# Patient Record
Sex: Female | Born: 2000 | Hispanic: Yes | Smoking: Former smoker
Health system: Southern US, Community
[De-identification: ages and names within clinical notes are randomized; demographics above are authoritative.]

## PROBLEM LIST (undated history)

## (undated) ENCOUNTER — Inpatient Hospital Stay: Payer: Self-pay

## (undated) ENCOUNTER — Ambulatory Visit: Admission: EM

## (undated) DIAGNOSIS — N83209 Unspecified ovarian cyst, unspecified side: Secondary | ICD-10-CM

## (undated) DIAGNOSIS — R519 Headache, unspecified: Secondary | ICD-10-CM

## (undated) DIAGNOSIS — F32A Depression, unspecified: Secondary | ICD-10-CM

## (undated) DIAGNOSIS — Z789 Other specified health status: Secondary | ICD-10-CM

## (undated) DIAGNOSIS — J45909 Unspecified asthma, uncomplicated: Secondary | ICD-10-CM

## (undated) DIAGNOSIS — D649 Anemia, unspecified: Secondary | ICD-10-CM

## (undated) HISTORY — DX: Unspecified ovarian cyst, unspecified side: N83.209

## (undated) HISTORY — DX: Anemia, unspecified: D64.9

## (undated) HISTORY — DX: Headache, unspecified: R51.9

## (undated) HISTORY — DX: Unspecified asthma, uncomplicated: J45.909

## (undated) HISTORY — DX: Depression, unspecified: F32.A

---
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
## 2018-01-28 ENCOUNTER — Emergency Department: Payer: Medicaid Other

## 2018-01-28 ENCOUNTER — Encounter: Payer: Self-pay | Admitting: Emergency Medicine

## 2018-01-28 ENCOUNTER — Emergency Department
Admission: EM | Admit: 2018-01-28 | Discharge: 2018-01-28 | Disposition: A | Discharge status: Home / Self Care | Payer: Medicaid Other | Attending: Student in an Organized Health Care Education/Training Program | Admitting: Student in an Organized Health Care Education/Training Program

## 2018-01-28 ENCOUNTER — Other Ambulatory Visit: Payer: Self-pay

## 2018-01-28 DIAGNOSIS — O209 Hemorrhage in early pregnancy, unspecified: Secondary | ICD-10-CM | POA: Insufficient documentation

## 2018-01-28 DIAGNOSIS — Z3A01 Less than 8 weeks gestation of pregnancy: Secondary | ICD-10-CM | POA: Diagnosis not present

## 2018-01-28 DIAGNOSIS — O4690 Antepartum hemorrhage, unspecified, unspecified trimester: Secondary | ICD-10-CM

## 2018-01-28 DIAGNOSIS — O208 Other hemorrhage in early pregnancy: Secondary | ICD-10-CM

## 2018-01-28 LAB — COMPREHENSIVE METABOLIC PANEL
ALK PHOS: 95 U/L (ref 38–126)
ALT: 24 U/L (ref 14–54)
ANION GAP: 8 (ref 5–15)
AST: 24 U/L (ref 15–41)
Albumin: 4.1 g/dL (ref 3.5–5.0)
BUN: 10 mg/dL (ref 6–20)
CALCIUM: 9.3 mg/dL (ref 8.9–10.3)
CO2: 28 mmol/L (ref 22–32)
CREATININE: 0.48 mg/dL (ref 0.44–1.00)
Chloride: 100 mmol/L — ABNORMAL LOW (ref 101–111)
GFR calc Af Amer: >60 mL/min (ref >60)
GFR calc non Af Amer: >60 mL/min (ref >60)
GLUCOSE: 136 mg/dL — AB (ref 65–99)
Potassium: 3 mmol/L — ABNORMAL LOW (ref 3.5–5.1)
SODIUM: 136 mmol/L (ref 135–145)
Total Bilirubin: 0.5 mg/dL (ref 0.3–1.2)
Total Protein: 7.6 g/dL (ref 6.5–8.1)

## 2018-01-28 LAB — URINALYSIS, COMPLETE (UACMP) WITH MICROSCOPIC
Bilirubin Urine: NEGATIVE
Glucose, UA: NEGATIVE mg/dL
HGB URINE DIPSTICK: NEGATIVE
Ketones, ur: 5 mg/dL — AB
Leukocytes, UA: NEGATIVE
NITRITE: NEGATIVE
Protein, ur: NEGATIVE mg/dL
Specific Gravity, Urine: 1.014 (ref 1.005–1.030)
pH: 6 (ref 5.0–8.0)

## 2018-01-28 LAB — ABO/RH: ABO/RH(D): O POS

## 2018-01-28 LAB — CBC
HEMATOCRIT: 42.5 % (ref 35.0–47.0)
HEMOGLOBIN: 14.3 g/dL (ref 12.0–16.0)
MCH: 30 pg (ref 26.0–34.0)
MCHC: 33.7 g/dL (ref 32.0–36.0)
MCV: 88.9 fL (ref 80.0–100.0)
Platelets: 188 10*3/uL (ref 150–440)
RBC: 4.78 MIL/uL (ref 3.80–5.20)
RDW: 13.7 % (ref 11.5–14.5)
WBC: 13.8 10*3/uL — AB (ref 3.6–11.0)

## 2018-01-28 LAB — HCG, QUANTITATIVE, PREGNANCY: hCG, Beta Chain, Quant, S: 23617 m[IU]/mL — ABNORMAL HIGH (ref <5)

## 2018-01-28 MED ORDER — DOXYLAMINE-PYRIDOXINE 10-10 MG PO TBEC: 1.0000 | DELAYED_RELEASE_TABLET | Freq: Two times a day (BID) | ORAL | 0 refills | Status: DC

## 2018-01-28 NOTE — Discharge Instructions (Addendum)

## 2018-01-28 NOTE — ED Triage Notes (Signed)
Pt in with co vaginal spotting since Saturday. Pt is [redacted] weeks pregnant, co lower abd cramping. Denies any dysuria at this time.

## 2018-01-28 NOTE — ED Provider Notes (Signed)
[facility] Emergency Department Provider Note    First MD Initiated Contact with Patient 01/28/18 2210     (approximate)  I have reviewed the triage vital signs and the nursing notes.   HISTORY  Chief Complaint Vaginal Bleeding    HPI Marcell Angerdith Cruz Flores is a 17 y.o. female G1P0 roughly [redacted] weeks pregnant [place] with several days of  crampy left lower quadrant pain as well as vaginal spotting started today.  Denies any nausea or vomiting.  No dysuria.  States the pain is mild to moderate.  Is not had any heavy vaginal bleeding.  No personal history of bleeding disorders.  No past medical history on file. No family history on file. No past surgical history on file. There are no active problems to display for this patient.     Prior to Admission medications   Medication Sig Start Date End Date Taking? Authorizing Provider  oxyCODONE-acetaminophen (PERCOCET) 5-325 MG tablet Take 1-2 tablets by mouth every 6 (six) hours as needed. 08/14/17   Emily FilbertWilliams, Jonathan E, MD    Allergies Penicillins and Augmentin [amoxicillin-pot clavulanate]    Social History Social History   Tobacco Use  . Smoking status: Never Smoker  . Smokeless tobacco: Never Used  Substance Use Topics  . Alcohol use: No  . Drug use: No    Review of Systems Patient denies headaches, rhinorrhea, blurry vision, numbness, shortness of breath, chest pain, edema, cough, abdominal pain, nausea, vomiting, diarrhea, dysuria, fevers, rashes or hallucinations unless otherwise stated above in HPI. ____________________________________________   PHYSICAL EXAM:  VITAL SIGNS: Vitals:   01/28/18 1956  BP: 117/62  Pulse: (!) 107  Resp: 20  Temp: 99.4 F (37.4 C)  SpO2: 99%    Constitutional: Alert and oriented. Well appearing and in no acute distress. Eyes: Conjunctivae are normal.  Head: Atraumatic. Nose: No congestion/rhinnorhea. Mouth/Throat: Mucous membranes are moist.   Neck:  No stridor. Painless ROM.  Cardiovascular: Normal rate, regular rhythm. Grossly normal heart sounds.  Good peripheral circulation. Respiratory: Normal respiratory effort.  No retractions. Lungs CTAB. Gastrointestinal: Soft and nontender. No distention. No abdominal bruits. No CVA tenderness. Genitourinary: deferred Musculoskeletal: No lower extremity tenderness nor edema.  No joint effusions. Neurologic:  Normal speech and language. No gross focal neurologic deficits are appreciated. No facial droop Skin:  Skin is warm, dry and intact. No rash noted. Psychiatric: Mood and affect are normal. Speech and behavior are normal.  ____________________________________________   LABS (all labs ordered are listed, but only abnormal results are displayed)  Results for orders placed or performed during the hospital encounter of 01/28/18 (from the past 24 hour(s))  CBC     Status: Abnormal   Collection Time: 01/28/18  8:01 PM  Result Value Ref Range   WBC 13.8 (H) 3.6 - 11.0 K/uL   RBC 4.78 3.80 - 5.20 MIL/uL   Hemoglobin 14.3 12.0 - 16.0 g/dL   HCT 16.142.5 09.635.0 - 04.547.0 %   MCV 88.9 80.0 - 100.0 fL   MCH 30.0 26.0 - 34.0 pg   MCHC 33.7 32.0 - 36.0 g/dL   RDW 40.913.7 81.111.5 - 91.414.5 %   Platelets 188 150 - 440 K/uL  Comprehensive metabolic panel     Status: Abnormal   Collection Time: 01/28/18  8:01 PM  Result Value Ref Range   Sodium 136 135 - 145 mmol/L   Potassium 3.0 (L) 3.5 - 5.1 mmol/L   Chloride 100 (L) 101 - 111 mmol/L   CO2  28 22 - 32 mmol/L   Glucose, Bld 136 (H) 65 - 99 mg/dL   BUN 10 6 - 20 mg/dL   Creatinine, Ser 1.61 0.44 - 1.00 mg/dL   Calcium 9.3 8.9 - 09.6 mg/dL   Total Protein 7.6 6.5 - 8.1 g/dL   Albumin 4.1 3.5 - 5.0 g/dL   AST 24 15 - 41 U/L   ALT 24 14 - 54 U/L   Alkaline Phosphatase 95 38 - 126 U/L   Total Bilirubin 0.5 0.3 - 1.2 mg/dL   GFR calc non Af Amer >60 >60 mL/min   GFR calc Af Amer >60 >60 mL/min   Anion gap 8 5 - 15  hCG, quantitative, pregnancy     Status:  Abnormal   Collection Time: 01/28/18  8:01 PM  Result Value Ref Range   hCG, Beta Chain, Quant, S 23,617 (H) <5 mIU/mL  Urinalysis, Complete w Microscopic     Status: Abnormal   Collection Time: 01/28/18  8:01 PM  Result Value Ref Range   Color, Urine YELLOW (A) YELLOW   APPearance CLEAR (A) CLEAR   Specific Gravity, Urine 1.014 1.005 - 1.030   pH 6.0 5.0 - 8.0   Glucose, UA NEGATIVE NEGATIVE mg/dL   Hgb urine dipstick NEGATIVE NEGATIVE   Bilirubin Urine NEGATIVE NEGATIVE   Ketones, ur 5 (A) NEGATIVE mg/dL   Protein, ur NEGATIVE NEGATIVE mg/dL   Nitrite NEGATIVE NEGATIVE   Leukocytes, UA NEGATIVE NEGATIVE   RBC / HPF 0-5 0 - 5 RBC/hpf   WBC, UA 0-5 0 - 5 WBC/hpf   Bacteria, UA RARE (A) NONE SEEN   Squamous Epithelial / LPF 0-5 (A) NONE SEEN   Mucus PRESENT   ABO/Rh     Status: None   Collection Time: 01/28/18  8:01 PM  Result Value Ref Range   ABO/RH(D)      O POS Performed at [facility], [address] [place], [place] [address]    ____________________________________________  ____________________________________________  RADIOLOGY  I personally reviewed all radiographic images ordered to evaluate for the above acute complaints and reviewed radiology reports and findings.  These findings were personally discussed with the patient.  Please see medical record for radiology report.  ____________________________________________   PROCEDURES  Procedure(s) performed:  Procedures    Critical Care performed: no ____________________________________________   INITIAL IMPRESSION / ASSESSMENT AND PLAN / ED COURSE  Pertinent labs & imaging results that were available during my care of the patient were reviewed by me and considered in my medical decision making (see chart for details).  DDX: Ectopic, threatened AB, DUB  Cedrica Brune is a 17 y.o. who presents to the ED with vaginal bleeding left lower quadrant abdominal pain and early pregnancy.   Ultrasound ordered to rule out ectopic shows reassuring intrauterine pregnancy.  Not clinically consistent with torsion.  Patient is Rh+ with reassuring blood work.  At this point I do believe patient stable and appropriate for outpatient follow-up.  Discussed signs and symptoms for which she should return immediately to the hospital.      As part of my medical decision making, I reviewed the following data within the electronic MEDICAL RECORD NUMBER Nursing notes reviewed and incorporated, Labs reviewed, notes from prior ED visits and [place] Controlled Substance Database   ____________________________________________   FINAL CLINICAL IMPRESSION(S) / ED DIAGNOSES  Final diagnoses:  Vaginal bleeding affecting early pregnancy      NEW MEDICATIONS STARTED DURING THIS VISIT:  New Prescriptions  No medications on file     Note:  This document was prepared using Dragon voice recognition software and may include unintentional dictation errors.    Willy Eddy, MD 01/28/18 2259

## 2018-02-11 LAB — OB RESULTS CONSOLE HEPATITIS B SURFACE ANTIGEN: Hepatitis B Surface Ag: NEGATIVE

## 2018-04-04 ENCOUNTER — Other Ambulatory Visit: Payer: Self-pay | Admitting: Obstetrics & Gynecology

## 2018-04-04 DIAGNOSIS — Z363 Encounter for antenatal screening for malformations: Secondary | ICD-10-CM

## 2018-04-15 ENCOUNTER — Other Ambulatory Visit: Payer: Self-pay

## 2018-04-18 ENCOUNTER — Ambulatory Visit (INDEPENDENT_AMBULATORY_CARE_PROVIDER_SITE_OTHER): Payer: Self-pay

## 2018-04-18 DIAGNOSIS — Z363 Encounter for antenatal screening for malformations: Secondary | ICD-10-CM

## 2018-07-02 LAB — OB RESULTS CONSOLE HIV ANTIBODY (ROUTINE TESTING)
HIV: NONREACTIVE
HIV: NONREACTIVE

## 2018-07-02 LAB — HM HIV SCREENING LAB: HM HIV Screening: NEGATIVE

## 2018-07-02 LAB — OB RESULTS CONSOLE RUBELLA ANTIBODY, IGM: Rubella: IMMUNE

## 2018-07-03 LAB — OB RESULTS CONSOLE RPR: RPR: NONREACTIVE

## 2018-08-29 LAB — OB RESULTS CONSOLE GC/CHLAMYDIA
Chlamydia: NEGATIVE
Gonorrhea: NEGATIVE

## 2018-08-29 LAB — OB RESULTS CONSOLE GBS: GBS: NEGATIVE

## 2018-09-12 LAB — OB RESULTS CONSOLE VARICELLA ZOSTER ANTIBODY, IGG: Varicella: IMMUNE

## 2018-09-12 LAB — OB RESULTS CONSOLE RUBELLA ANTIBODY, IGM: Rubella: IMMUNE

## 2018-09-17 ENCOUNTER — Other Ambulatory Visit: Payer: Self-pay | Admitting: Obstetrics & Gynecology

## 2018-09-21 ENCOUNTER — Observation Stay
Admission: EM | Admit: 2018-09-21 | Discharge: 2018-09-21 | Disposition: A | Discharge status: Home / Self Care | Payer: Medicaid Other | Source: Home / Self Care | Admitting: Obstetrics and Gynecology

## 2018-09-21 ENCOUNTER — Other Ambulatory Visit: Payer: Self-pay

## 2018-09-21 ENCOUNTER — Encounter: Payer: Self-pay | Admitting: *Deleted

## 2018-09-21 DIAGNOSIS — O479 False labor, unspecified: Secondary | ICD-10-CM | POA: Diagnosis present

## 2018-09-21 DIAGNOSIS — O4703 False labor before 37 completed weeks of gestation, third trimester: Secondary | ICD-10-CM

## 2018-09-21 DIAGNOSIS — Z3A39 39 weeks gestation of pregnancy: Secondary | ICD-10-CM

## 2018-09-21 DIAGNOSIS — O471 False labor at or after 37 completed weeks of gestation: Secondary | ICD-10-CM | POA: Diagnosis not present

## 2018-09-21 DIAGNOSIS — Z880 Allergy status to penicillin: Secondary | ICD-10-CM

## 2018-09-21 HISTORY — DX: Other specified health status: Z78.9

## 2018-09-21 MED ORDER — DEXTROSE IN LACTATED RINGERS 5 % IV SOLN
Freq: Once | INTRAVENOUS | Status: AC
  Administered 2018-09-21: 999 mL via INTRAVENOUS

## 2018-09-21 MED ORDER — DEXTROSE IN LACTATED RINGERS 5 % IV SOLN: INTRAVENOUS | Status: DC

## 2018-09-21 MED ORDER — LACTATED RINGERS IV BOLUS
1000.0000 mL | Freq: Once | INTRAVENOUS | Status: AC
  Administered 2018-09-21: 1000 mL via INTRAVENOUS

## 2018-09-21 NOTE — OB Triage Note (Signed)
Pt arrived from home with complaints of contractions since 5 am. Pt reports being evaluated earlier today and being sent home. Pt denies any vaginal bleeding or leaking of fluid. Pt reports positive fetal movement.

## 2018-09-21 NOTE — OB Triage Note (Signed)
Ctx since 0500 this am. Reports good fetal movement, denies vaginal bleeding, LOF. Brenda Mcclure, Brenda Mcclure

## 2018-09-21 NOTE — Discharge Summary (Signed)
Physician Discharge Summary   Patient ID: Brenda Mcclure [id number] 17 y.o. [birth year]  Admit date: 09/21/2018  Discharge date and time: No discharge date for patient encounter.   Admitting Physician: Natale Milchhristanna R Schuman, MD   Discharge Physician: Adelene Idlerhristanna Schuman MD  Admission Diagnoses: contractions  Discharge Diagnoses: Early labor  Admission Condition: good  Discharged Condition: good  Indication for Admission: triage evaluation  Hospital Course: Patient was admitted and observed. She did not make cervical change. She was discharged home with labor precautions.  Consults: None  Significant Diagnostic Studies: None  Treatments: IV hydration  Discharge Exam: BP 133/65   Pulse 95   Temp 97.9 F (36.6 C) (Oral)   Resp 19   Ht 4\' 11"  (1.499 m)   Wt 77.6 kg   LMP 12/13/2017 Comment: [redacted] weeks pregnant   BMI 34.54 kg/m   General Appearance:    Alert, cooperative, no distress, appears stated age  Head:    Normocephalic, without obvious abnormality, atraumatic  Eyes:    PERRL, conjunctiva/corneas clear, EOM's intact, fundi    benign, both eyes  Ears:    Normal TM's and external ear canals, both ears  Nose:   Nares normal, septum midline, mucosa normal, no drainage    or sinus tenderness  Throat:   Lips, mucosa, and tongue normal; teeth and gums normal  Neck:   Supple, symmetrical, trachea midline, no adenopathy;    thyroid:  no enlargement/tenderness/nodules; no carotid   bruit or JVD  Back:     Symmetric, no curvature, ROM normal, no CVA tenderness  Lungs:     Clear to auscultation bilaterally, respirations unlabored  Chest Wall:    No tenderness or deformity   Heart:    Regular rate and rhythm, S1 and S2 normal, no murmur, rub   or gallop  Breast Exam:    No tenderness, masses, or nipple abnormality  Abdomen:     Soft, non-tender, bowel sounds active all four quadrants,    no masses, no organomegaly  Genitalia:    Normal female without lesion, discharge or  tenderness  Rectal:    Normal tone, normal prostate, no masses or tenderness;   guaiac negative stool  Extremities:   Extremities normal, atraumatic, no cyanosis or edema  Pulses:   2+ and symmetric all extremities  Skin:   Skin color, texture, turgor normal, no rashes or lesions  Lymph nodes:   Cervical, supraclavicular, and axillary nodes normal  Neurologic:   CNII-XII intact, normal strength, sensation and reflexes    throughout    Disposition: Discharge disposition: 01-Home or Self Care       Patient Instructions:  Allergies as of 09/21/2018      Reactions   Augmentin [amoxicillin-pot Clavulanate] Rash      Medication List    TAKE these medications   multivitamin-prenatal 27-0.8 MG Tabs tablet Take 1 tablet by mouth daily at 12 noon.      Activity: activity as tolerated Diet: regular diet Wound Care: none needed  Follow-up with [facility] Department  in 1 week.  Signed: Natale MilchChristanna R Schuman 09/21/2018 12:23 PM

## 2018-09-21 NOTE — Progress Notes (Signed)
Patient ID: Brenda Mcclure, female   DOB: 2001-02-10, 17 y.o.   MRN: [id number]  Triage Note  Brenda Angerdith Cruz Mcclure is an 17 y.o. female.  HPI: patient presents today to labor and delivery complaining of contractions. They started at 5 am this morning. She feels them every 3-4 minutes. She rates them as strong. She denies leakage of fluid. She denies vaginal bleeding. She reports fetal movement. She denies complications this pregancy such as high blood pressure or diabetes.  She was last checked in the clinic as was 0.5cm. She was 1 cm today by nursing. She did not make change on repeat cervical examinations.   Past Medical History:  Diagnosis Date  . Medical history non-contributory     Past Surgical History:  Procedure Laterality Date  . NO PAST SURGERIES      No family history on file.  Social History:  reports that she has never smoked. She has never used smokeless tobacco. She reports that she does not drink alcohol or use drugs.  Allergies:  Allergies  Allergen Reactions  . Augmentin [Amoxicillin-Pot Clavulanate] Rash   Medications: I have reviewed the patient's current medications.  No results found for this or any previous visit (from the past 48 hour(s)).  No results found.  Review of Systems  Constitutional: Negative for chills, fever, malaise/fatigue and weight loss.  HENT: Negative for congestion, hearing loss and sinus pain.   Eyes: Negative for blurred vision and double vision.  Respiratory: Negative for cough, sputum production, shortness of breath and wheezing.   Cardiovascular: Negative for chest pain, palpitations, orthopnea and leg swelling.  Gastrointestinal: Negative for abdominal pain, constipation, diarrhea, nausea and vomiting.  Genitourinary: Negative for dysuria, flank pain, frequency, hematuria and urgency.  Musculoskeletal: Negative for back pain, falls and joint pain.  Skin: Negative for itching and rash.  Neurological: Negative for dizziness and  headaches.  Psychiatric/Behavioral: Negative for depression, substance abuse and suicidal ideas. The patient is not nervous/anxious.    Temperature 98.4 F (36.9 C), temperature source Oral, resp. rate 16, height 4\' 11"  (1.499 m), weight 77.6 kg, last menstrual period 12/13/2017. Physical Exam  Nursing note and vitals reviewed. Constitutional: She is oriented to person, place, and time. She appears well-developed and well-nourished.  HENT:  Head: Normocephalic and atraumatic.  Cardiovascular: Normal rate and regular rhythm.  Respiratory: Effort normal and breath sounds normal.  GI: Soft. Bowel sounds are normal.  Musculoskeletal: Normal range of motion.  Neurological: She is alert and oriented to person, place, and time.  Skin: Skin is warm and dry.  Psychiatric: She has a normal mood and affect. Her behavior is normal. Judgment and thought content normal.    SVE: 1/70/-3 NST: 140 bpm baseline, moderate variability, 15x15 accelerations, no decelerations. Tocometer : 3-4 minutes  Assessment/Plan: 17 yo G1P0 8634w4d 1. Early labor. Labor precautions reviewed. IVF hydration. Will discharge home after hydration.   Update: she was monitored for another 2 hours because of severe pain. No cervical change other than softening of the cervix. Discharged patient home with labor precautions.    R  09/21/2018, 10:45 AM

## 2018-09-21 NOTE — Discharge Summary (Signed)
Physician Discharge Summary   Patient ID: Brenda Mcclure [id number] 17 y.o. [birth year]  Admit date: 09/21/2018  Discharge date and time: No discharge date for patient encounter.   Admitting Physician: Natale Milchhristanna R Jakyah Bradby, MD   Discharge Physician: Adelene Idlerhristanna Nhung Danko MD  Admission Diagnoses: [redacted] wks pregnant/contractions  Discharge Diagnoses: Same as above  Admission Condition: good  Discharged Condition: good  Indication for Admission: Labor evaluation  Hospital Course: Patient was admitted. SVE showed her to be 3 cm/80/-3, she was unchanged on repeat examination. No LOF. Small bloody show. Discussed option of returning home vs being admitted. She is agreeable to discharge to continue early labor at home. She lives only 15 minutes away. She will return with vaginal bleeding, leakage of fluid, on increased frequency and intensity of contractions.   NST: 150 bpm baseline, moderate variability, 15x15 accelerations, no decelerations. Tocometer : q 6-7 minutes  Consults: None  Significant Diagnostic Studies: none  Treatments: none  Discharge Exam: BP 131/80 (BP Location: Right Arm)   Pulse (!) 114   Temp 99.5 F (37.5 C) (Oral)   Resp 18   Ht 4\' 11"  (1.499 m)   Wt 77.6 kg   LMP 12/13/2017 Comment: [redacted] weeks pregnant   BMI 34.54 kg/m   General Appearance:    Alert, cooperative, no distress, appears stated age  Head:    Normocephalic, without obvious abnormality, atraumatic  Eyes:    PERRL, conjunctiva/corneas clear, EOM's intact, fundi    benign, both eyes  Ears:    Normal TM's and external ear canals, both ears  Nose:   Nares normal, septum midline, mucosa normal, no drainage    or sinus tenderness  Throat:   Lips, mucosa, and tongue normal; teeth and gums normal  Neck:   Supple, symmetrical, trachea midline, no adenopathy;    thyroid:  no enlargement/tenderness/nodules; no carotid   bruit or JVD  Back:     Symmetric, no curvature, ROM normal, no CVA tenderness   Lungs:     Clear to auscultation bilaterally, respirations unlabored  Chest Wall:    No tenderness or deformity   Heart:    Regular rate and rhythm, S1 and S2 normal, no murmur, rub   or gallop  Breast Exam:    No tenderness, masses, or nipple abnormality  Abdomen:     Soft, non-tender, bowel sounds active all four quadrants,    no masses, no organomegaly  Genitalia:    Normal female without lesion, discharge or tenderness  Rectal:    Normal tone, normal prostate, no masses or tenderness;   guaiac negative stool  Extremities:   Extremities normal, atraumatic, no cyanosis or edema  Pulses:   2+ and symmetric all extremities  Skin:   Skin color, texture, turgor normal, no rashes or lesions  Lymph nodes:   Cervical, supraclavicular, and axillary nodes normal  Neurologic:   CNII-XII intact, normal strength, sensation and reflexes    throughout    Disposition: Discharge disposition: 01-Home or Self Care       Patient Instructions:  Allergies as of 09/21/2018      Reactions   Augmentin [amoxicillin-pot Clavulanate] Rash      Medication List    TAKE these medications   multivitamin-prenatal 27-0.8 MG Tabs tablet Take 1 tablet by mouth daily at 12 noon.      Activity: activity as tolerated Diet: regular diet Wound Care: none needed  Follow-up with [facility]  in 1 week.  Signed: Natale MilchChristanna R Chanice Brenton 09/21/2018 10:33 PM

## 2018-09-22 ENCOUNTER — Inpatient Hospital Stay: Payer: Medicaid Other | Admitting: Anesthesiology

## 2018-09-22 ENCOUNTER — Other Ambulatory Visit: Payer: Self-pay

## 2018-09-22 ENCOUNTER — Inpatient Hospital Stay
Admission: EM | Admit: 2018-09-22 | Discharge: 2018-09-25 | DRG: 787 | Disposition: A | Discharge status: Home / Self Care | Payer: Medicaid Other | Attending: Certified Nurse Midwife | Admitting: Certified Nurse Midwife

## 2018-09-22 DIAGNOSIS — Z98891 History of uterine scar from previous surgery: Secondary | ICD-10-CM

## 2018-09-22 DIAGNOSIS — D62 Acute posthemorrhagic anemia: Secondary | ICD-10-CM | POA: Diagnosis not present

## 2018-09-22 DIAGNOSIS — Z3A39 39 weeks gestation of pregnancy: Secondary | ICD-10-CM | POA: Diagnosis not present

## 2018-09-22 DIAGNOSIS — O411290 Chorioamnionitis, unspecified trimester, not applicable or unspecified: Secondary | ICD-10-CM | POA: Diagnosis not present

## 2018-09-22 DIAGNOSIS — Z3483 Encounter for supervision of other normal pregnancy, third trimester: Secondary | ICD-10-CM | POA: Diagnosis present

## 2018-09-22 DIAGNOSIS — O411230 Chorioamnionitis, third trimester, not applicable or unspecified: Secondary | ICD-10-CM | POA: Diagnosis present

## 2018-09-22 DIAGNOSIS — O9081 Anemia of the puerperium: Secondary | ICD-10-CM | POA: Diagnosis not present

## 2018-09-22 LAB — CBC
HCT: 33.4 % — ABNORMAL LOW (ref 36.0–46.0)
Hemoglobin: 10.9 g/dL — ABNORMAL LOW (ref 12.0–15.0)
MCH: 26.5 pg (ref 26.0–34.0)
MCHC: 32.6 g/dL (ref 30.0–36.0)
MCV: 81.3 fL (ref 80.0–100.0)
Platelets: 170 10*3/uL (ref 150–400)
RBC: 4.11 MIL/uL (ref 3.87–5.11)
RDW: 14.5 % (ref 11.5–15.5)
WBC: 16 10*3/uL — ABNORMAL HIGH (ref 4.0–10.5)
nRBC: 0 % (ref 0.0–0.2)

## 2018-09-22 MED ORDER — OXYTOCIN BOLUS FROM INFUSION: 500.0000 mL | Freq: Once | INTRAVENOUS | Status: DC

## 2018-09-22 MED ORDER — LIDOCAINE HCL (PF) 1 % IJ SOLN
INTRAMUSCULAR | Status: DC | PRN
  Administered 2018-09-22: 3 mL

## 2018-09-22 MED ORDER — LACTATED RINGERS IV SOLN
INTRAVENOUS | Status: DC
  Administered 2018-09-22: 100 mL via INTRAVENOUS
  Administered 2018-09-22 (×3): via INTRAVENOUS
  Administered 2018-09-22: 1000 mL via INTRAVENOUS
  Administered 2018-09-22 – 2018-09-23 (×2): via INTRAVENOUS

## 2018-09-22 MED ORDER — OXYTOCIN 40 UNITS IN LACTATED RINGERS INFUSION - SIMPLE MED
2.5000 [IU]/h | INTRAVENOUS | Status: DC
  Administered 2018-09-23: 400 mL via INTRAVENOUS
  Filled 2018-09-22: qty 1000

## 2018-09-22 MED ORDER — FENTANYL 2.5 MCG/ML W/ROPIVACAINE 0.15% IN NS 100 ML EPIDURAL (ARMC)
EPIDURAL | Status: AC
  Administered 2018-09-22: 250 ug
  Filled 2018-09-22: qty 100

## 2018-09-22 MED ORDER — OXYTOCIN 40 UNITS IN LACTATED RINGERS INFUSION - SIMPLE MED
1.0000 m[IU]/min | INTRAVENOUS | Status: DC
  Administered 2018-09-22: 2 m[IU]/min via INTRAVENOUS
  Filled 2018-09-22: qty 1000

## 2018-09-22 MED ORDER — EPHEDRINE 5 MG/ML INJ
5.0000 mg | Freq: Once | INTRAVENOUS | Status: AC
  Administered 2018-09-22: 5 mg via INTRAVENOUS

## 2018-09-22 MED ORDER — FENTANYL 2.5 MCG/ML W/ROPIVACAINE 0.15% IN NS 100 ML EPIDURAL (ARMC)
EPIDURAL | Status: AC
  Filled 2018-09-22: qty 100

## 2018-09-22 MED ORDER — TERBUTALINE SULFATE 1 MG/ML IJ SOLN: 0.2500 mg | Freq: Once | INTRAMUSCULAR | Status: DC | PRN

## 2018-09-22 MED ORDER — BUPIVACAINE HCL (PF) 0.25 % IJ SOLN
INTRAMUSCULAR | Status: DC | PRN
  Administered 2018-09-22: 5 mL via EPIDURAL
  Administered 2018-09-22: 4 mL via EPIDURAL

## 2018-09-22 MED ORDER — LACTATED RINGERS IV SOLN
500.0000 mL | INTRAVENOUS | Status: DC | PRN
  Administered 2018-09-22 (×2): 500 mL via INTRAVENOUS
  Administered 2018-09-23: 03:00:00 via INTRAVENOUS

## 2018-09-22 MED ORDER — LIDOCAINE HCL (PF) 1 % IJ SOLN: 30.0000 mL | INTRAMUSCULAR | Status: DC | PRN

## 2018-09-22 MED ORDER — ONDANSETRON HCL 4 MG/2ML IJ SOLN
4.0000 mg | Freq: Four times a day (QID) | INTRAMUSCULAR | Status: DC | PRN
  Administered 2018-09-23: 4 mg via INTRAVENOUS

## 2018-09-22 MED ORDER — BUTORPHANOL TARTRATE 2 MG/ML IJ SOLN: 1.0000 mg | INTRAMUSCULAR | Status: DC | PRN

## 2018-09-22 MED ORDER — ACETAMINOPHEN 325 MG PO TABS: 650.0000 mg | ORAL_TABLET | ORAL | Status: DC | PRN

## 2018-09-22 MED ORDER — LIDOCAINE-EPINEPHRINE (PF) 1.5 %-1:200000 IJ SOLN
INTRAMUSCULAR | Status: DC | PRN
  Administered 2018-09-22: 3 mL via PERINEURAL

## 2018-09-22 MED ORDER — EPHEDRINE 5 MG/ML INJ: 10.0000 mg | Freq: Once | INTRAVENOUS | Status: DC

## 2018-09-22 MED ORDER — FENTANYL 2.5 MCG/ML W/ROPIVACAINE 0.15% IN NS 100 ML EPIDURAL (ARMC)
EPIDURAL | Status: DC | PRN
  Administered 2018-09-22: 12 mL/h via EPIDURAL
  Administered 2018-09-23: 250 ug via EPIDURAL

## 2018-09-22 NOTE — Anesthesia Preprocedure Evaluation (Addendum)
Anesthesia Evaluation  Patient identified by MRN, date of birth, ID band Patient awake    Reviewed: Allergy & Precautions, H&P , NPO status , Patient's Chart, lab work & pertinent test results  History of Anesthesia Complications Negative for: history of anesthetic complications  Airway Mallampati: II  TM Distance: <3 FB Neck ROM: full    Dental no notable dental hx. (+) Chipped   Pulmonary neg pulmonary ROS,    Pulmonary exam normal        Cardiovascular (-) hypertensionnegative cardio ROS Normal cardiovascular exam     Neuro/Psych negative neurological ROS  negative psych ROS   GI/Hepatic negative GI ROS, Neg liver ROS,   Endo/Other  negative endocrine ROS  Renal/GU negative Renal ROS  negative genitourinary   Musculoskeletal   Abdominal   Peds  Hematology negative hematology ROS (+)   Anesthesia Other Findings   Reproductive/Obstetrics (+) Pregnancy                            Anesthesia Physical Anesthesia Plan  ASA: II  Anesthesia Plan: Epidural   Post-op Pain Management:    Induction:   PONV Risk Score and Plan:   Airway Management Planned:   Additional Equipment:   Intra-op Plan:   Post-operative Plan:   Informed Consent: I have reviewed the patients History and Physical, chart, labs and discussed the procedure including the risks, benefits and alternatives for the proposed anesthesia with the patient or authorized representative who has indicated his/her understanding and acceptance.     Plan Discussed with: CRNA and Anesthesiologist  Anesthesia Plan Comments:         Anesthesia Quick Evaluation

## 2018-09-22 NOTE — H&P (Signed)
OB History & Physical   History of Present Illness:  Chief Complaint: contractions  HPI:  Brenda Mcclure is a 17 y.o. G1P0 female at 66w5ddated by 5 week ultrasound.  Her pregnancy has been mildly complicated by 1st trimester bleeding, BMI 30.    She reports contractions for the past 2 days that have gotten closer together.   She denies leakage of fluid.   She reports scant vaginal bleeding.   She reports fetal movement.  She has had little sleep in the past 2 days.  She has attempted to eat but has been vomiting.   Maternal Medical History:   Past Medical History:  Diagnosis Date  . Medical history non-contributory     Past Surgical History:  Procedure Laterality Date  . NO PAST SURGERIES      Allergies  Allergen Reactions  . Augmentin [Amoxicillin-Pot Clavulanate] Rash    Prior to Admission medications   Medication Sig Start Date End Date Taking? Authorizing Provider  Prenatal Vit-Fe Fumarate-FA (MULTIVITAMIN-PRENATAL) 27-0.8 MG TABS tablet Take 1 tablet by mouth daily at 12 noon.   Yes [provider]    OB History  Gravida Para Term Preterm AB Living  1            SAB TAB Ectopic Multiple Live Births               # Outcome Date GA Lbr Len/2nd Weight Sex Delivery Anes PTL Lv  1 Current             Prenatal care site: [facility]  Social History: She  reports that she has never smoked. She has never used smokeless tobacco. She reports that she does not drink alcohol or use drugs.  Family History: family history is not on file.  She denies a family history of gynecologic cancers  Review of Systems: Negative x 10 systems reviewed except as noted in the HPI.    Physical Exam:  Vital Signs: BP 112/63 (BP Location: Left Arm)   Pulse (!) 112   Temp 98.1 F (36.7 C) (Oral)   Resp 18   Ht '4\' 11"'  (1.499 m)   Wt 77.6 kg   LMP 12/13/2017 Comment: [redacted] weeks pregnant   BMI 34.54 kg/m  Constitutional: Well nourished, well developed female in no acute distress.   HEENT: normal Skin: Warm and dry.  Cardiovascular: Regular rate and rhythm.   Extremity: no edema  Respiratory: Clear to auscultation bilateral. Normal respiratory effort Abdomen: soft, nontender, nondistended, no abnormal masses, no epigastric pain and FHT present Back: no CVAT Neuro: DTRs 2+, Cranial nerves grossly intact Psych: Alert and Oriented x3. No memory deficits. Normal mood and affect.  MS: normal gait, normal bilateral lower extremity ROM/strength/stability.  Pelvic exam: by RN 4.5/80/-1   Pertinent Results:  Prenatal Labs: Blood type/Rh O positive  Antibody screen negative  Rubella MMR x2  Varicella Varicella x2    RPR Non-reactive  HBsAg negative  HIV negative  GC negative  Chlamydia negative  Genetic screening declined  1 hour GTT 87  3 hour GTT NA  GBS negative on 11/8   Baseline FHR: 140 beats/min   Variability: moderate   Accelerations: present   Decelerations: absent Contractions: present frequency: every 4-5 Overall assessment: reassuring   Assessment:  Brenda Eggeris a 17y.o. G1P0 female at 346w5dith regular contractions and slow cervical change   Plan:  1. Admit to Labor & Delivery for epidural, augmentation 2. CBC, T&S, Clrs,  IVF 3. GBS negative.   4. Fetal well-being: Category I 5. Reassess cervix after patient is comfortable with epidural   Rod Can, CNM 09/22/2018 8:37 AM

## 2018-09-22 NOTE — OB Triage Note (Signed)
Pt presents to L&D with ctx q474min apart. Pt denies leaking of fluid and states + FM. Cervical exam on arrival was 4.5/80/-1. CNM notified of exam and arrival. Monitors applied and assessing. VSS.

## 2018-09-22 NOTE — Progress Notes (Signed)
  Labor Progress Note   17 y.o. G1P0 @ 6536w5d , admitted for  Pregnancy, Labor Management.   Subjective:  Comfortable with epidural and feeling some pressure again. Patient has made slow cervical change and pitocin has stayed at 9 for a period of time due to uncertainty of contraction pattern. Discussed placing internal monitor for contractions so we can more effectively increase pitocin. Patient is agreeable to having internal monitor placed.   Objective:  BP 108/61   Pulse (!) 109   Temp 98.4 F (36.9 C) (Oral)   Resp 16   Ht 4\' 11"  (1.499 m)   Wt 77.6 kg   LMP 12/13/2017   SpO2 96%   BMI 34.54 kg/m  Abd: mild Extr: no edema SVE: CERVIX: 9 cm dilated, 100 effaced, +1 station  EFM: FHR: 150 bpm, variability: moderate with some periods of minimal,  accelerations:  Present,  decelerations:  A couple of small variables seen Toco: every 2-4 minutes, internal monitor placed  Labs: I have reviewed the patient's lab results.   Assessment & Plan:  G1P0 @ 5636w5d, admitted for  Pregnancy and Labor/Delivery Management  1. Pain management: epidural. 2. FWB: FHT category II and overall reassuring.  3. ID: GBS negative 4. Labor management: titrate pitocin to adequate pattern  All discussed with patient, see orders   Tresea MallJane Filicia Scogin, CNM [facility], [facility] 09/22/2018  9:48 PM

## 2018-09-22 NOTE — Anesthesia Procedure Notes (Signed)
Epidural Patient location during procedure: OB Start time: 09/22/2018 10:00 AM End time: 09/22/2018 10:51 AM  Staffing Resident/CRNA: Junious SilkNoles, Shaye Lagace, CRNA Performed: resident/CRNA   Preanesthetic Checklist Completed: patient identified, site marked, surgical consent, pre-op evaluation, timeout performed, IV checked, risks and benefits discussed and monitors and equipment checked  Epidural Patient position: sitting Prep: Betadine Patient monitoring: heart rate, continuous pulse ox and blood pressure Approach: midline Location: L3-L4 Injection technique: LOR air  Needle:  Needle type: Tuohy  Needle gauge: 17 G Needle length: 9 cm and 9 Catheter type: closed end flexible Catheter size: 20 Guage Test dose: negative and 1.5% lidocaine with Epi 1:200 K  Assessment Sensory level: T10 Events: blood not aspirated, injection not painful, no injection resistance, negative IV test and no paresthesia  Additional Notes   Patient tolerated the insertion well without complications.Reason for block:procedure for pain

## 2018-09-22 NOTE — Progress Notes (Addendum)
  Labor Progress Note   17 y.o. G1P0 @ 724w5d , admitted for  Pregnancy, Labor Management.   Subjective:  Patient appears comfortable as she is now sleeping since having an epidural. Earlier discussion of use of augmentation medication once patient is comfortable.  Objective:  BP (!) 116/55   Pulse (!) 114   Temp 98 F (36.7 C) (Oral)   Resp 16   Ht 4\' 11"  (1.499 m)   Wt 77.6 kg   LMP 12/13/2017   SpO2 97%   BMI 34.54 kg/m  Abd: mild Extr: no edema SVE: most recent exam by RN 5/80/-1  EFM: FHR: 145 bpm, variability: moderate,  accelerations:  Present,  decelerations:  Absent Toco: Frequency: Every 5-7 minutes Labs: I have reviewed the patient's lab results.   Assessment & Plan:  G1P0 @ [redacted]w[redacted]d, admitted for  Pregnancy and Labor/Delivery Management  1. Pain management: epidural. 2. FWB: FHT category I.  3. ID: GBS negative 4. Labor management: start pitocin augmentation   Tresea MallJane Laekyn Rayos, CNM [facility], [facility] 09/22/2018  12:00 PM

## 2018-09-22 NOTE — Progress Notes (Signed)
  Labor Progress Note   17 y.o. G1P0 @ 5892w5d , admitted for  Pregnancy, Labor Management.   Subjective:  Patient is comfortable with epidural. She denies any s/s of hypotension. She has received fluid bolus and ephedrine for hypotension following epidural.  Objective:  BP (!) 93/42   Pulse (!) 115   Temp 98.6 F (37 C) (Oral)   Resp 16   Ht 4\' 11"  (1.499 m)   Wt 77.6 kg   LMP 12/13/2017   SpO2 97%   BMI 34.54 kg/m  Abd: mild Extr: no edema SVE: CERVIX: 6.5 cm dilated, 90/100 effaced, -1 to 0 station, BBOW- no AROM at this time due to minimal variability  EFM: FHR: 150 bpm, variability: minimal to moderate,  accelerations:  Present,  Decelerations: a couple variables seen Toco: Frequency: Every 3-4 minutes Labs: I have reviewed the patient's lab results.   Assessment & Plan:  G1P0 @ 2192w5d, admitted for  Pregnancy and Labor/Delivery Management  1. Pain management: epidural. 2. FWB: FHT category II, overall reassuring.  3. ID: GBS negative 4. Labor management: continue augmentation  All discussed with patient, see orders   Tresea MallJane Graiden Henes, CNM [facility], [facility] 09/22/2018  3:13 PM

## 2018-09-22 NOTE — Progress Notes (Addendum)
  Labor Progress Note   17 y.o. G1P0 @ 2651w5d , admitted for  Pregnancy, Labor Management.   Subjective:  Patient was feeling some pressure but still generally comfortable with epidural.  Objective:  BP (!) 102/50   Pulse (!) 116   Temp 98 F (36.7 C) (Oral)   Resp 16   Ht 4\' 11"  (1.499 m)   Wt 77.6 kg   LMP 12/13/2017   SpO2 97%   BMI 34.54 kg/m  Abd: mild Extr: no edema SVE: CERVIX: 7 cm dilated, 100 effaced, 0 station, BBOW AROM clear/pink fluid  EFM: FHR: 150 bpm, variability: moderate,  accelerations:  Present,  decelerations:  Absent Toco: Frequency: Every 2-5 minutes Labs: I have reviewed the patient's lab results.   Assessment & Plan:  G1P0 @ 951w5d, admitted for  Pregnancy and Labor/Delivery Management  1. Pain management: epidural. 2. FWB: FHT category I.  3. ID: GBS negative 4. Labor management: titrate pitocin to adequate pattern  All discussed with patient, see orders   Tresea MallJane Jayland Null, CNM [facility], [facility] 09/22/2018  5:50 PM

## 2018-09-23 ENCOUNTER — Encounter
Admission: EM | Disposition: A | Discharge status: Home / Self Care | Payer: Self-pay | Source: Home / Self Care | Attending: Certified Nurse Midwife

## 2018-09-23 DIAGNOSIS — Z98891 History of uterine scar from previous surgery: Secondary | ICD-10-CM

## 2018-09-23 DIAGNOSIS — O411230 Chorioamnionitis, third trimester, not applicable or unspecified: Secondary | ICD-10-CM

## 2018-09-23 DIAGNOSIS — Z9289 Personal history of other medical treatment: Secondary | ICD-10-CM

## 2018-09-23 DIAGNOSIS — O411290 Chorioamnionitis, unspecified trimester, not applicable or unspecified: Secondary | ICD-10-CM | POA: Diagnosis not present

## 2018-09-23 DIAGNOSIS — Z3A39 39 weeks gestation of pregnancy: Secondary | ICD-10-CM

## 2018-09-23 HISTORY — DX: Personal history of other medical treatment: Z92.89

## 2018-09-23 LAB — RPR: RPR Ser Ql: NONREACTIVE

## 2018-09-23 SURGERY — Surgical Case
Anesthesia: Epidural

## 2018-09-23 MED ORDER — NALBUPHINE HCL 10 MG/ML IJ SOLN: 5.0000 mg | Freq: Once | INTRAMUSCULAR | Status: DC | PRN

## 2018-09-23 MED ORDER — KETOROLAC TROMETHAMINE 30 MG/ML IJ SOLN: 30.0000 mg | Freq: Four times a day (QID) | INTRAMUSCULAR | Status: AC

## 2018-09-23 MED ORDER — MORPHINE SULFATE (PF) 0.5 MG/ML IJ SOLN
INTRAMUSCULAR | Status: AC
  Filled 2018-09-23: qty 10

## 2018-09-23 MED ORDER — WITCH HAZEL-GLYCERIN EX PADS: 1.0000 "application " | MEDICATED_PAD | CUTANEOUS | Status: DC | PRN

## 2018-09-23 MED ORDER — BUPIVACAINE HCL (PF) 0.5 % IJ SOLN
INTRAMUSCULAR | Status: AC
  Filled 2018-09-23: qty 30

## 2018-09-23 MED ORDER — MEPERIDINE HCL 25 MG/ML IJ SOLN: 6.2500 mg | INTRAMUSCULAR | Status: DC | PRN

## 2018-09-23 MED ORDER — DIPHENHYDRAMINE HCL 25 MG PO CAPS: 25.0000 mg | ORAL_CAPSULE | ORAL | Status: DC | PRN

## 2018-09-23 MED ORDER — BUPIVACAINE 0.25 % ON-Q PUMP DUAL CATH 400 ML
400.0000 mL | INJECTION | Status: DC
  Filled 2018-09-23: qty 400

## 2018-09-23 MED ORDER — GENTAMICIN SULFATE 40 MG/ML IJ SOLN
280.0000 mg | INTRAVENOUS | Status: AC
  Administered 2018-09-23: 280 mg via INTRAVENOUS
  Filled 2018-09-23: qty 7

## 2018-09-23 MED ORDER — SODIUM CHLORIDE 0.9 % IV SOLN
INTRAVENOUS | Status: DC | PRN
  Administered 2018-09-23: 50 ug/min via INTRAVENOUS

## 2018-09-23 MED ORDER — FENTANYL CITRATE (PF) 100 MCG/2ML IJ SOLN
INTRAMUSCULAR | Status: AC
  Filled 2018-09-23: qty 2

## 2018-09-23 MED ORDER — OXYCODONE HCL 5 MG/5ML PO SOLN
5.0000 mg | Freq: Once | ORAL | Status: DC | PRN
  Filled 2018-09-23: qty 5

## 2018-09-23 MED ORDER — CEFAZOLIN SODIUM-DEXTROSE 2-4 GM/100ML-% IV SOLN
2.0000 g | INTRAVENOUS | Status: AC
  Administered 2018-09-23: 2 g via INTRAVENOUS
  Filled 2018-09-23: qty 100

## 2018-09-23 MED ORDER — NALOXONE HCL 0.4 MG/ML IJ SOLN: 0.4000 mg | INTRAMUSCULAR | Status: DC | PRN

## 2018-09-23 MED ORDER — OXYCODONE-ACETAMINOPHEN 5-325 MG PO TABS: 2.0000 | ORAL_TABLET | ORAL | Status: DC | PRN

## 2018-09-23 MED ORDER — SODIUM CHLORIDE 0.9 % IV SOLN
2.0000 g | Freq: Four times a day (QID) | INTRAVENOUS | Status: DC
  Administered 2018-09-23: 2 g via INTRAVENOUS
  Filled 2018-09-23: qty 2000

## 2018-09-23 MED ORDER — SOD CITRATE-CITRIC ACID 500-334 MG/5ML PO SOLN
ORAL | Status: AC
  Administered 2018-09-23: 30 mL
  Filled 2018-09-23: qty 15

## 2018-09-23 MED ORDER — OXYCODONE-ACETAMINOPHEN 5-325 MG PO TABS: 1.0000 | ORAL_TABLET | ORAL | Status: DC | PRN

## 2018-09-23 MED ORDER — COCONUT OIL OIL: 1.0000 "application " | TOPICAL_OIL | Status: DC | PRN

## 2018-09-23 MED ORDER — PRENATAL MULTIVITAMIN CH
1.0000 | ORAL_TABLET | Freq: Every day | ORAL | Status: DC
  Administered 2018-09-23 – 2018-09-24 (×2): 1 via ORAL
  Filled 2018-09-23 (×2): qty 1

## 2018-09-23 MED ORDER — ACETAMINOPHEN 325 MG PO TABS: 650.0000 mg | ORAL_TABLET | Freq: Four times a day (QID) | ORAL | Status: DC

## 2018-09-23 MED ORDER — DIBUCAINE 1 % RE OINT: 1.0000 "application " | TOPICAL_OINTMENT | RECTAL | Status: DC | PRN

## 2018-09-23 MED ORDER — SODIUM CHLORIDE 0.9 % IV SOLN
500.0000 mg | INTRAVENOUS | Status: AC
  Administered 2018-09-23: 500 mg via INTRAVENOUS
  Filled 2018-09-23: qty 500

## 2018-09-23 MED ORDER — OXYCODONE HCL 5 MG PO TABS: 5.0000 mg | ORAL_TABLET | Freq: Once | ORAL | Status: DC | PRN

## 2018-09-23 MED ORDER — MENTHOL 3 MG MT LOZG
1.0000 | LOZENGE | OROMUCOSAL | Status: DC | PRN
  Filled 2018-09-23: qty 9

## 2018-09-23 MED ORDER — FENTANYL CITRATE (PF) 100 MCG/2ML IJ SOLN
INTRAMUSCULAR | Status: DC | PRN
  Administered 2018-09-23: 100 ug via EPIDURAL
  Administered 2018-09-23: 100 ug via INTRAVENOUS

## 2018-09-23 MED ORDER — NALBUPHINE HCL 10 MG/ML IJ SOLN: 5.0000 mg | INTRAMUSCULAR | Status: DC | PRN

## 2018-09-23 MED ORDER — BUPIVACAINE HCL 0.5 % IJ SOLN
INTRAMUSCULAR | Status: DC | PRN
  Administered 2018-09-23: 10 mL

## 2018-09-23 MED ORDER — MORPHINE SULFATE-NACL 0.5-0.9 MG/ML-% IV SOSY
PREFILLED_SYRINGE | INTRAVENOUS | Status: DC | PRN
  Administered 2018-09-23: 3 mg via EPIDURAL

## 2018-09-23 MED ORDER — OXYCODONE HCL 5 MG PO TABS
5.0000 mg | ORAL_TABLET | ORAL | Status: AC | PRN
  Administered 2018-09-23 – 2018-09-24 (×3): 5 mg via ORAL
  Filled 2018-09-23 (×3): qty 1

## 2018-09-23 MED ORDER — ONDANSETRON HCL 4 MG/2ML IJ SOLN: 4.0000 mg | Freq: Three times a day (TID) | INTRAMUSCULAR | Status: DC | PRN

## 2018-09-23 MED ORDER — IBUPROFEN 600 MG PO TABS
600.0000 mg | ORAL_TABLET | Freq: Four times a day (QID) | ORAL | Status: DC
  Administered 2018-09-24 – 2018-09-25 (×4): 600 mg via ORAL
  Filled 2018-09-23 (×4): qty 1

## 2018-09-23 MED ORDER — LIDOCAINE HCL (PF) 2 % IJ SOLN
INTRAMUSCULAR | Status: DC | PRN
  Administered 2018-09-23: 80 mg via INTRADERMAL
  Administered 2018-09-23 (×4): 100 mg via EPIDURAL

## 2018-09-23 MED ORDER — BUPIVACAINE HCL (PF) 0.5 % IJ SOLN: 5.0000 mL | Freq: Once | INTRAMUSCULAR | Status: DC

## 2018-09-23 MED ORDER — SIMETHICONE 80 MG PO CHEW
80.0000 mg | CHEWABLE_TABLET | Freq: Three times a day (TID) | ORAL | Status: DC
  Administered 2018-09-23 – 2018-09-25 (×6): 80 mg via ORAL
  Filled 2018-09-23 (×7): qty 1

## 2018-09-23 MED ORDER — SODIUM CHLORIDE 0.9% FLUSH: 3.0000 mL | INTRAVENOUS | Status: DC | PRN

## 2018-09-23 MED ORDER — FERROUS SULFATE 325 (65 FE) MG PO TABS
325.0000 mg | ORAL_TABLET | Freq: Two times a day (BID) | ORAL | Status: DC
  Administered 2018-09-23 – 2018-09-25 (×5): 325 mg via ORAL
  Filled 2018-09-23 (×5): qty 1

## 2018-09-23 MED ORDER — DIPHENHYDRAMINE HCL 50 MG/ML IJ SOLN: 12.5000 mg | INTRAMUSCULAR | Status: DC | PRN

## 2018-09-23 MED ORDER — SODIUM CHLORIDE 0.9 % IV SOLN
INTRAVENOUS | Status: AC
  Filled 2018-09-23: qty 2000

## 2018-09-23 MED ORDER — FENTANYL CITRATE (PF) 100 MCG/2ML IJ SOLN
25.0000 ug | INTRAMUSCULAR | Status: DC | PRN
  Administered 2018-09-23: 50 ug via INTRAVENOUS

## 2018-09-23 MED ORDER — EPHEDRINE SULFATE-NACL 50-0.9 MG/10ML-% IV SOSY
PREFILLED_SYRINGE | INTRAVENOUS | Status: DC | PRN
  Administered 2018-09-23: 10 mg via INTRAVENOUS

## 2018-09-23 MED ORDER — OXYTOCIN 40 UNITS IN LACTATED RINGERS INFUSION - SIMPLE MED
2.5000 [IU]/h | INTRAVENOUS | Status: DC
  Filled 2018-09-23: qty 1000

## 2018-09-23 MED ORDER — KETOROLAC TROMETHAMINE 30 MG/ML IJ SOLN
30.0000 mg | Freq: Four times a day (QID) | INTRAMUSCULAR | Status: AC
  Administered 2018-09-23 – 2018-09-24 (×4): 30 mg via INTRAVENOUS
  Filled 2018-09-23 (×4): qty 1

## 2018-09-23 MED ORDER — DIPHENHYDRAMINE HCL 25 MG PO CAPS: 25.0000 mg | ORAL_CAPSULE | Freq: Four times a day (QID) | ORAL | Status: DC | PRN

## 2018-09-23 MED ORDER — SENNOSIDES-DOCUSATE SODIUM 8.6-50 MG PO TABS
2.0000 | ORAL_TABLET | ORAL | Status: DC
  Administered 2018-09-23 – 2018-09-25 (×2): 2 via ORAL
  Filled 2018-09-23 (×2): qty 2

## 2018-09-23 MED ORDER — LACTATED RINGERS IV SOLN
INTRAVENOUS | Status: DC
  Administered 2018-09-23 – 2018-09-24 (×2): via INTRAVENOUS

## 2018-09-23 MED ORDER — ACETAMINOPHEN 325 MG PO TABS
650.0000 mg | ORAL_TABLET | Freq: Four times a day (QID) | ORAL | Status: AC
  Administered 2018-09-23 (×3): 650 mg via ORAL
  Filled 2018-09-23 (×3): qty 2

## 2018-09-23 SURGICAL SUPPLY — 30 items
CANISTER SUCT 3000ML PPV (MISCELLANEOUS) ×3 IMPLANT
CATH KIT ON-Q SILVERSOAK 5IN (CATHETERS) ×6 IMPLANT
CLOSURE WOUND 1/2 X4 (GAUZE/BANDAGES/DRESSINGS) ×1
COVER WAND RF STERILE (DRAPES) IMPLANT
DERMABOND ADVANCED (GAUZE/BANDAGES/DRESSINGS) ×2
DERMABOND ADVANCED .7 DNX12 (GAUZE/BANDAGES/DRESSINGS) ×1 IMPLANT
DRSG OPSITE POSTOP 4X10 (GAUZE/BANDAGES/DRESSINGS) IMPLANT
DRSG TELFA 3X8 NADH (GAUZE/BANDAGES/DRESSINGS) ×3 IMPLANT
ELECT CAUTERY BLADE 6.4 (BLADE) IMPLANT
ELECT REM PT RETURN 9FT ADLT (ELECTROSURGICAL) ×3
ELECTRODE REM PT RTRN 9FT ADLT (ELECTROSURGICAL) ×1 IMPLANT
GAUZE SPONGE 4X4 12PLY STRL (GAUZE/BANDAGES/DRESSINGS) ×3 IMPLANT
GAUZE SPONGE 4X4 16PLY XRAY LF (GAUZE/BANDAGES/DRESSINGS) ×3 IMPLANT
GLOVE BIO SURGEON STRL SZ7 (GLOVE) ×9 IMPLANT
GLOVE INDICATOR 7.5 STRL GRN (GLOVE) ×9 IMPLANT
GOWN STRL REUS W/ TWL LRG LVL3 (GOWN DISPOSABLE) ×3 IMPLANT
GOWN STRL REUS W/TWL LRG LVL3 (GOWN DISPOSABLE) ×6
NS IRRIG 1000ML POUR BTL (IV SOLUTION) ×3 IMPLANT
PACK C SECTION AR (MISCELLANEOUS) ×3 IMPLANT
PAD OB MATERNITY 4.3X12.25 (PERSONAL CARE ITEMS) ×3 IMPLANT
PAD PREP 24X41 OB/GYN DISP (PERSONAL CARE ITEMS) ×3 IMPLANT
STRIP CLOSURE SKIN 1/2X4 (GAUZE/BANDAGES/DRESSINGS) ×2 IMPLANT
SUT MNCRL 4-0 (SUTURE) ×2
SUT MNCRL 4-0 27XMFL (SUTURE) ×1
SUT PDS AB 1 TP1 96 (SUTURE) ×3 IMPLANT
SUT PLAIN GUT 0 (SUTURE) IMPLANT
SUT VIC AB 0 CTX 36 (SUTURE) ×4
SUT VIC AB 0 CTX36XBRD ANBCTRL (SUTURE) ×2 IMPLANT
SUTURE MNCRL 4-0 27XMF (SUTURE) ×1 IMPLANT
SWABSTK COMLB BENZOIN TINCTURE (MISCELLANEOUS) IMPLANT

## 2018-09-23 NOTE — Progress Notes (Signed)
  Labor Progress Note   17 y.o. G1P0 @ 5421w6d , admitted for  Pregnancy, Labor Management.   Subjective:  Patient has been feeling a strong urge to push and she is pushing well. She has developed a fever and we discussed the need to treat for chorioamnionitis. She admits only rash with augmentin- she denies breathing difficulty.   Objective:  BP (!) 109/48   Pulse (!) 111   Temp (!) 102.5 F (39.2 C) (Axillary)   Resp 16   Ht 4\' 11"  (1.499 m)   Wt 77.6 kg   LMP 12/13/2017   SpO2 95%   BMI 34.54 kg/m  Abd: mild Extr: no edema SVE: CERVIX: anterior lip is reduceable   EFM: FHR: 175 bpm, variability: moderate,  accelerations:  Present,  decelerations:  Absent Toco: Frequency: Every 2 minutes Labs: I have reviewed the patient's lab results.   Assessment & Plan:  G1P0 @ 5421w6d, admitted for  Pregnancy and Labor/Delivery Management  1. Pain management: epidural. 2. FWB: FHT category II 3. Treat chorio with ampicillin 2g and gentamycin 5mg /kg.  4. ID: GBS negative 5. Labor management: continue pushing  All discussed with patient, see orders   Tresea MallJane Gregg Holster, CNM [facility], [facility] 09/23/2018  12:54 AM

## 2018-09-23 NOTE — Transfer of Care (Signed)
Immediate Anesthesia Transfer of Care Note  Patient: Brenda Mcclure  Procedure(s) Performed: CESAREAN SECTION (N/A )  Patient Location: PACU  Anesthesia Type:Epidural  Level of Consciousness: awake, alert  and oriented  Airway & Oxygen Therapy: Patient Spontanous Breathing and Patient connected to nasal cannula oxygen  Post-op Assessment: Report given to RN and Post -op Vital signs reviewed and stable  Post vital signs: Reviewed and stable  Last Vitals:  Vitals Value Taken Time  BP    Temp    Pulse    Resp    SpO2      Last Pain:  Vitals:   09/23/18 0240  TempSrc: Oral  PainSc:       Patients Stated Pain Goal: 0 (09/22/18 0958)  Complications: No apparent anesthesia complications

## 2018-09-23 NOTE — Discharge Summary (Addendum)
OB Discharge Summary     Patient Name: Brenda Mcclure DOB: 2001/03/15 MRN: [id number]  Date of admission: 09/22/2018 Delivering MD: Thomasene Mohair, MD  Date of Delivery: 09/23/2018  Date of discharge: 09/25/2018  Admitting diagnosis: 39 weeks contractions Intrauterine pregnancy: [redacted]w[redacted]d     Secondary diagnosis: None     Discharge diagnosis: Term Pregnancy Delivered                                                                                                Post partum procedures:blood transfusion  Augmentation: AROM and Pitocin  Complications: Intrauterine Inflammation or infection (Chorioamniotis)  Hospital course:  Onset of Labor With Unplanned C/S  17 y.o. yo G1P1001 at [redacted]w[redacted]d was admitted in Active Labor on 09/22/2018. Patient had a labor course significant for slow progression to complete. She pushed for 3 hours and was not close to delivery. While pushing she developed signs of chorioamnionitis and was started on antibiotics. Membrane Rupture Time/Date: 5:40 PM ,09/22/2018   The patient went for cesarean section due to second stage arrest, and delivered a Viable infant,09/23/2018  Details of operation can be found in separate operative note. Patient had an uncomplicated postpartum course.  She is ambulating,tolerating a regular diet, passing flatus, and urinating well.  Patient is discharged home in stable condition 09/25/18.  Physical exam  Vitals:   09/24/18 2149 09/24/18 2209 09/25/18 0107 09/25/18 0752  BP: 119/76 110/71 109/62 109/73  Pulse: (!) 103 99 91 89  Resp: 20  16 18   Temp: 98.2 F (36.8 C) 98.1 F (36.7 C) 98.3 F (36.8 C) 98.1 F (36.7 C)  TempSrc: Oral Oral Oral Oral  SpO2: 99% 99% 99% 98%  Weight:      Height:       General: alert, cooperative and no distress Lochia: appropriate Uterine Fundus: firm Incision: Pressure dressing removed. Steri strips are C/D/I, On Q pump is intact. DVT Evaluation: No evidence of DVT seen on physical exam.  Labs: Lab  Results  Component Value Date   WBC 15.9 (H) 09/25/2018   HGB 8.7 (L) 09/25/2018   HCT 26.9 (L) 09/25/2018   MCV 82.8 09/25/2018   PLT 143 (L) 09/25/2018    Discharge instruction: per After Visit Summary.  Medications:  Allergies as of 09/25/2018      Reactions   Augmentin [amoxicillin-pot Clavulanate] Rash      Medication List    TAKE these medications   multivitamin-prenatal 27-0.8 MG Tabs tablet Take 1 tablet by mouth daily at 12 noon.   oxyCODONE-acetaminophen 5-325 MG tablet Commonly known as:  PERCOCET/ROXICET Take 1 tablet by mouth every 6 (six) hours as needed for up to 5 days for moderate pain (pain score 4-7/10).            Discharge Care Instructions  (From admission, onward)         Start     Ordered   09/25/18 0000  Discharge wound care:    Comments:  Keep incision dry, clean.   09/25/18 4098          Diet: routine diet  Activity: Advance  as tolerated. Pelvic rest for 6 weeks.   Outpatient follow up: Follow-up Information    Conard NovakJackson, Stephen D, MD. Schedule an appointment as soon as possible for a visit in 1 week(s).   Specialty:  Obstetrics and Gynecology Why:  Postop incision check Contact information: [address] [place] [address] [phone]             Postpartum contraception: IUD Paragard Rhogam Given postpartum: no Rubella vaccine given postpartum: no Varicella vaccine given postpartum: no TDaP given antepartum or postpartum: AP 07/02/2018 Influenza vaccine given: 07/30/2018  Newborn Data: Live born female  Birth Weight: 7 lb 2.6 oz (3250 g) APGAR: 9, 9  Newborn Delivery   Birth date/time:  09/23/2018 03:53:00 Delivery type:  C-Section, Low Transverse C-section categorization:  Primary      Baby Feeding: Bottle and Breast  Disposition:home with mother  SIGNED: Tresea MallJane Austan Nicholl, CNM

## 2018-09-23 NOTE — Op Note (Signed)
Cesarean Section Operative Note    Brenda Mcclure   09/23/2018   Pre-operative Diagnosis:  1) intrauterine pregnancy at [redacted]w[redacted]d  2) second stage arrest 3) chorioamnionitis  Post-operative Diagnosis:  1) intrauterine pregnancy at [redacted]w[redacted]d  2) second stage arrest 3) chorioamnionitis   Procedure: Primary Low Transverse Cesarean delivery via Pfannenstiel incision with double-layer uterine closure  Surgeon: Surgeon(s) and Role:    Conard Novak, MD - Primary   Assistants: Tresea Mall, CNM: No other capable assistant available, in surgery requiring high level assistant.  Anesthesia: epidural   Findings:  1) normal appearing gravid uterus, fallopian tubes, and ovaries 2) viable female infant with weightof 3,250 grams (7 lb 3 oz), APGARs 9 and 9   Estimated Blood Loss: 1,000 mL  Total IV Fluids: 600 ml crystalloid  Urine Output: 150 mL clear urine at end of case  Specimens: none  Complications: no complications  Disposition: PACU - hemodynamically stable.   Maternal Condition: stable   Baby condition / location:  Couplet care / Skin to Skin  Procedure Details:  The patient was seen in the Holding Room. The risks, benefits, complications, treatment options, and expected outcomes were discussed with the patient. The patient concurred with the proposed plan, giving informed consent. identified as Brenda Mcclure and the procedure verified as C-Section Delivery. A Time Out was held and the above information confirmed.   After induction of anesthesia, the patient was draped and prepped in the usual sterile manner. A Pfannenstiel incision was made and carried down through the subcutaneous tissue to the fascia. Fascial incision was made and extended transversely. The fascia was separated from the underlying rectus tissue superiorly and inferiorly. The peritoneum was identified and entered. Peritoneal incision was extended longitudinally. The bladder flap was bluntly and  sharply freed from the lower uterine segment. A low transverse uterine incision was made and the hysterotomy was extended with cranial-caudal tension. Delivered from cephalic presentation was a 3,250 gram Living newborn infant(s) or Female with Apgar scores of 9 at one minute and 9 at five minutes. Cord ph was not sent the umbilical cord was clamped and cut cord blood was obtained for evaluation. The placenta was removed Intact and appeared normal. The uterine outline, tubes and ovaries appeared normal. The uterine incision was closed with running locked sutures of 0 Vicryl.  A second layer of the same suture was thrown in an imbricating fashion.  Hemostasis was assured.  The uterus was returned to the abdomen and the paracolic gutters were cleared of all clots and debris.  The rectus muscles were inspected and found to be hemostatic.  The peritoneum was re-approximated with a 0 vicryl suture in a running fashion.  The On-Q catheter pumps were inserted in accordance with the manufacturer's recommendations.  The catheters were inserted approximately 4cm cephelad to the incision line, approximately 1cm apart, straddling the midline.  They were inserted to a depth of the 4th mark. They were positioned superficial to the rectus abdominus muscles and deep to the rectus fascia.    The fascia was then reapproximated with running sutures of 1-0 PDS, looped. The subcutaneous tissue was re-approximated using a 3-0 vicryl with three interrupted stitches.  The subcuticular closure was performed using 4-0 monocryl. The skin closure was reinforced using benzoin and 1/2" steri-strips.  The On-Q catheters were bolused with 5 mL of 0.5% marcaine plain for a total of 10 mL.  The catheters were affixed to the skin with surgical skin glue, steri-strips,  and tegaderm.    Instrument, sponge, and needle counts were correct prior the abdominal closure and were correct at the conclusion of the case.  The patient received Ancef 2  gram IV and azithromycin 500 mg IV prior to skin incision (within 30 minutes).  Additionally, a vaginal prep was done prior to the start of the case.  For VTE prophylaxis she was wearing SCDs throughout the case.  The assistant surgeon was an MD due to lack of availability of another Sales promotion account executivequalified assistant.   Signed: Conard NovakStephen D. Ashya Nicolaisen, MD 09/23/2018 4:58 AM

## 2018-09-23 NOTE — Lactation Note (Signed)
This note was copied from a baby's chart. Lactation Consultation Note  Patient Name: Brenda Mcclure ZOXWR'UToday's Date: 09/23/2018 Reason for consult: Follow-up assessment  At the 2 pm feeding, Mom requested help to breastfeed her baby because "he wouldn't wake up". Baby was in thick blankets in grandmother's arms with loud tv on and bright overhead lights. I gently informed her of how environment stimuli and skin to skin can help with her concerns. Lights were dimmed, volume on tv turned down, Baby brought to mom skin to skin and she immediately started to root and show feeding cues. We started on right breast since that has been a challenge for her. The nipple is erect, but somewhat short, and  somewhat dense areola without much elasticity to nipple/areola junction, all of which can make latch a little challenging. Compressing areola and using "sandwich hold" helps a lot. After a few minutes of nursing, she got fussy again with poor latch. As mom was moving her to other breast, Mom's IV dislodged completely. I stopped the IV machine and cleaned up Mom while the NT Melissa went to notify her RN Enrique SackKendra. I placed a bandaid in the clear IV site. Baby had blood on her as well and NT planned to have the baby bathed. She was not cuing any moore at this point. Mom has LC contact info on board in room. I had reviewed what to expect with feeds the first few days while showing her where info is in her BF booklet for future reference.   Maternal Data    Feeding Feeding Type: Breast Fed  LATCH Score Latch: Repeated attempts needed to sustain latch, nipple held in mouth throughout feeding, stimulation needed to elicit sucking reflex.  Audible Swallowing: A few with stimulation  Type of Nipple: Everted at rest and after stimulation  Comfort (Breast/Nipple): Soft / non-tender  Hold (Positioning): Full assist, staff holds infant at breast  LATCH Score: 6  Interventions Interventions: Assisted with  latch;Skin to skin;Breast compression;Adjust position;Position options;Support pillows  Lactation Tools Discussed/Used     Consult Status Consult Status: Follow-up Date: 09/24/18    Brenda Mcclure 09/23/2018, 6:03 PM

## 2018-09-23 NOTE — Anesthesia Post-op Follow-up Note (Signed)
Anesthesia QCDR form completed.        

## 2018-09-23 NOTE — Progress Notes (Signed)
Patient has been pushing for 3 hours without delivery and appears to be remote from delivery even with good effort. She has a significant amount of caput that has developed while pushing. The patient is refusing to push any more. We discussed assistance with operative vaginal delivery.  She does not want this option, either.  Given her chorioamnionitis, the fact that she has pushed for 3 hours and is still remote from delivery with a category 2 tracing, will proceed to urgent cesarean delivery.  Thomasene MohairStephen Cyndra Feinberg, MD, Merlinda FrederickFACOG [facility], [facility] 09/23/2018 2:48 AM

## 2018-09-24 ENCOUNTER — Encounter: Payer: Self-pay | Admitting: Obstetrics and Gynecology

## 2018-09-24 DIAGNOSIS — Z3A39 39 weeks gestation of pregnancy: Secondary | ICD-10-CM

## 2018-09-24 DIAGNOSIS — O411230 Chorioamnionitis, third trimester, not applicable or unspecified: Secondary | ICD-10-CM

## 2018-09-24 LAB — CBC
HCT: 20.7 % — ABNORMAL LOW (ref 36.0–46.0)
HEMATOCRIT: 23.5 % — AB (ref 36.0–46.0)
HEMOGLOBIN: 6.5 g/dL — AB (ref 12.0–15.0)
Hemoglobin: 7.7 g/dL — ABNORMAL LOW (ref 12.0–15.0)
MCH: 26.2 pg (ref 26.0–34.0)
MCH: 27 pg (ref 26.0–34.0)
MCHC: 31.4 g/dL (ref 30.0–36.0)
MCHC: 32.8 g/dL (ref 30.0–36.0)
MCV: 83.5 fL (ref 80.0–100.0)
MCV: 82.5 fL (ref 80.0–100.0)
Platelets: 118 10*3/uL — ABNORMAL LOW (ref 150–400)
Platelets: 128 10*3/uL — ABNORMAL LOW (ref 150–400)
RBC: 2.48 MIL/uL — ABNORMAL LOW (ref 3.87–5.11)
RBC: 2.85 MIL/uL — ABNORMAL LOW (ref 3.87–5.11)
RDW: 14.8 % (ref 11.5–15.5)
RDW: 14.7 % (ref 11.5–15.5)
WBC: 16.1 10*3/uL — ABNORMAL HIGH (ref 4.0–10.5)
WBC: 16.5 10*3/uL — ABNORMAL HIGH (ref 4.0–10.5)
nRBC: 0 % (ref 0.0–0.2)
nRBC: 0 % (ref 0.0–0.2)

## 2018-09-24 LAB — PREPARE RBC (CROSSMATCH)

## 2018-09-24 MED ORDER — ACETAMINOPHEN 325 MG PO TABS
650.0000 mg | ORAL_TABLET | Freq: Once | ORAL | Status: AC
  Administered 2018-09-24: 650 mg via ORAL
  Filled 2018-09-24: qty 2

## 2018-09-24 MED ORDER — SODIUM CHLORIDE 0.9% IV SOLUTION: Freq: Once | INTRAVENOUS | Status: DC

## 2018-09-24 MED ORDER — SODIUM CHLORIDE 0.9% IV SOLUTION
Freq: Once | INTRAVENOUS | Status: AC
  Administered 2018-09-24: 11:00:00 via INTRAVENOUS

## 2018-09-24 MED ORDER — DIPHENHYDRAMINE HCL 25 MG PO CAPS
25.0000 mg | ORAL_CAPSULE | Freq: Once | ORAL | Status: AC
  Administered 2018-09-24: 25 mg via ORAL
  Filled 2018-09-24: qty 1

## 2018-09-24 NOTE — Progress Notes (Signed)
POD#1 pLTCS Subjective:  Sitting up in bed, eating breakfast. Pain control is adequate with medications. Voiding without difficulty. Tolerating a regular diet. Ambulating with some dizziness.  Objective:  Blood pressure 109/60, pulse 96, temperature 98.1 F (36.7 C), temperature source Oral, resp. rate 18, height 4\' 11"  (1.499 m), weight 77.6 kg, last menstrual period 12/13/2017, SpO2 99 %, currently breastfeeding.  General: NAD Pulmonary: no increased work of breathing Abdomen: non-distended, non-tender Uterus:  fundus firm; lochia appropriate Incision: Dressing C/D/I Extremities: no edema, no erythema, no tenderness, no signs of DVT  Results for orders placed or performed during the hospital encounter of 09/22/18 (from the past 72 hour(s))  CBC     Status: Abnormal   Collection Time: 09/22/18  9:24 AM  Result Value Ref Range   WBC 16.0 (H) 4.0 - 10.5 K/uL   RBC 4.11 3.87 - 5.11 MIL/uL   Hemoglobin 10.9 (L) 12.0 - 15.0 g/dL   HCT 16.1 (L) 09.6 - 04.5 %   MCV 81.3 80.0 - 100.0 fL   MCH 26.5 26.0 - 34.0 pg   MCHC 32.6 30.0 - 36.0 g/dL   RDW 40.9 81.1 - 91.4 %   Platelets 170 150 - 400 K/uL   nRBC 0.0 0.0 - 0.2 %    Comment: Performed at [facility], [address] [place], [place] [address]  RPR     Status: None   Collection Time: 09/22/18  9:24 AM  Result Value Ref Range   RPR Ser Ql Non Reactive Non Reactive    Comment: (NOTE) Performed At: [facility] [address] [place], [place] [id number] Jolene Schimke MD QI:[id number]   Type and screen     Status: None (Preliminary result)   Collection Time: 09/22/18 10:15 AM  Result Value Ref Range   ABO/RH(D) O POS    Antibody Screen NEG    Sample Expiration      09/25/2018 Performed at [facility], [address] [place], [place] [address]    Unit Number [id number]    Blood Component Type RED CELLS,LR    Unit division 00    Status of Unit ALLOCATED    Transfusion Status OK TO  TRANSFUSE    Crossmatch Result Compatible   CBC     Status: Abnormal   Collection Time: 09/24/18  6:11 AM  Result Value Ref Range   WBC 16.1 (H) 4.0 - 10.5 K/uL   RBC 2.48 (L) 3.87 - 5.11 MIL/uL   Hemoglobin 6.5 (L) 12.0 - 15.0 g/dL   HCT 47.4 (L) 25.9 - 56.3 %   MCV 83.5 80.0 - 100.0 fL   MCH 26.2 26.0 - 34.0 pg   MCHC 31.4 30.0 - 36.0 g/dL   RDW 87.5 64.3 - 32.9 %   Platelets 118 (L) 150 - 400 K/uL    Comment: Immature Platelet Fraction may be clinically indicated, consider ordering this additional test [id number]    nRBC 0.0 0.0 - 0.2 %    Comment: Performed at [facility], [address] [place], [place] [address]  Prepare RBC     Status: None   Collection Time: 09/24/18  9:13 AM  Result Value Ref Range   Order Confirmation      ORDER PROCESSED BY BLOOD BANK Performed at [facility], [address] [place], [place] [address]     Assessment:   17 y.o. G1P1001 post-operative day # 1 in stable condition.  Plan:   1) Acute blood loss anemia -  symptomatic, dizziness with ambulation with hemoglobin at 6.5. Blood transfusion indicated. After discussion with and consent from patient, ordered 1 unit PRBC. Consider second unit if indicated after repeat CBC following transfusion of first unit.  2) Blood Type --/--/O POS (12/02 1015)   3) Rubella Immune (11/22 0000) / Varicella vaccine x2 / TDAP status: received antepartum 07/02/2018  4) Breast and formula feeding.  5) Contraception: Considering Paragard.  6) Disposition: Continue postpartum care.  Marcelyn BruinsJacelyn Schmid, CNM 09/24/2018  9:51 AM

## 2018-09-24 NOTE — Anesthesia Postprocedure Evaluation (Signed)
Anesthesia Post Note  Patient: Brenda Mcclure  Procedure(s) Performed: CESAREAN SECTION (N/A )  Patient location during evaluation: Mother Baby Anesthesia Type: Epidural Level of consciousness: awake, awake and alert and oriented Pain management: pain level controlled Vital Signs Assessment: post-procedure vital signs reviewed and stable Respiratory status: spontaneous breathing Cardiovascular status: blood pressure returned to baseline Postop Assessment: no headache, no backache, adequate PO intake, able to ambulate, epidural receding, no apparent nausea or vomiting and patient able to bend at knees Anesthetic complications: no     Last Vitals:  Vitals:   09/24/18 0350 09/24/18 0500  BP: (!) 114/53   Pulse: (!) 113 96  Resp: 16   Temp: 37.2 C   SpO2: 99% 99%    Last Pain:  Vitals:   09/24/18 0443  TempSrc:   PainSc: Asleep                 [name]

## 2018-09-24 NOTE — Anesthesia Post-op Follow-up Note (Signed)
  Anesthesia Pain Follow-up Note  Patient: Brenda Mcclure  Day #: 1  Date of Follow-up: 09/24/2018 Time: 7:41 AM  Last Vitals:  Vitals:   09/24/18 0350 09/24/18 0500  BP: (!) 114/53   Pulse: (!) 113 96  Resp: 16   Temp: 37.2 C   SpO2: 99% 99%    Level of Consciousness: alert  Pain: none   Side Effects:None  Catheter Site Exam:clean, dry, no drainage     Plan: D/C from anesthesia care at surgeon's request  Karoline Caldwelleana Oneka Parada

## 2018-09-25 DIAGNOSIS — O411230 Chorioamnionitis, third trimester, not applicable or unspecified: Secondary | ICD-10-CM

## 2018-09-25 DIAGNOSIS — Z3A39 39 weeks gestation of pregnancy: Secondary | ICD-10-CM

## 2018-09-25 LAB — CBC
HCT: 26.9 % — ABNORMAL LOW (ref 36.0–46.0)
Hemoglobin: 8.7 g/dL — ABNORMAL LOW (ref 12.0–15.0)
MCH: 26.8 pg (ref 26.0–34.0)
MCHC: 32.3 g/dL (ref 30.0–36.0)
MCV: 82.8 fL (ref 80.0–100.0)
Platelets: 143 10*3/uL — ABNORMAL LOW (ref 150–400)
RBC: 3.25 MIL/uL — ABNORMAL LOW (ref 3.87–5.11)
RDW: 14.6 % (ref 11.5–15.5)
WBC: 15.9 10*3/uL — ABNORMAL HIGH (ref 4.0–10.5)
nRBC: 0 % (ref 0.0–0.2)

## 2018-09-25 LAB — BPAM RBC
Blood Product Expiration Date: 201912112359
Blood Product Expiration Date: 201912272359
ISSUE DATE / TIME: 201912041102
ISSUE DATE / TIME: 201912042151
Unit Type and Rh: 5100
Unit Type and Rh: 9500

## 2018-09-25 LAB — TYPE AND SCREEN
ABO/RH(D): O POS
Antibody Screen: NEGATIVE
Unit division: 0
Unit division: 0

## 2018-09-25 MED ORDER — OXYCODONE-ACETAMINOPHEN 5-325 MG PO TABS: 1.0000 | ORAL_TABLET | Freq: Four times a day (QID) | ORAL | 0 refills | Status: AC | PRN

## 2018-09-25 NOTE — Progress Notes (Signed)
DC to home with NB.  To car via auxillary.  RX given for home use

## 2018-09-25 NOTE — Lactation Note (Signed)
This note was copied from a baby's chart. Lactation Consultation Note  Patient Name: Brenda Mcclure Today's Date: 09/25/2018     Maternal Data    Feeding    LATCH Score                   Interventions    Lactation Tools Discussed/Used     Consult Status  LC to room to meet mother. Mother states that she has been pumping but is also putting infant to breast. Mother states that she is doing well and denies any concerns at this time. LC gave contact information and instructed mother to call if she needs any assistance.    [name] 09/25/2018, 10:27 AM

## 2018-09-30 ENCOUNTER — Telehealth: Payer: Self-pay | Admitting: Obstetrics and Gynecology

## 2018-09-30 ENCOUNTER — Ambulatory Visit (INDEPENDENT_AMBULATORY_CARE_PROVIDER_SITE_OTHER): Payer: Medicaid Other | Admitting: Obstetrics and Gynecology

## 2018-09-30 ENCOUNTER — Encounter: Payer: Self-pay | Admitting: Obstetrics and Gynecology

## 2018-09-30 VITALS — BP 114/78 | Ht 59.0 in | Wt 154.0 lb

## 2018-09-30 DIAGNOSIS — Z09 Encounter for follow-up examination after completed treatment for conditions other than malignant neoplasm: Secondary | ICD-10-CM

## 2018-09-30 DIAGNOSIS — Z98891 History of uterine scar from previous surgery: Secondary | ICD-10-CM

## 2018-09-30 NOTE — Telephone Encounter (Signed)
1/14 at 130 for paraguard with sdj

## 2018-09-30 NOTE — Progress Notes (Signed)
   Postoperative Follow-up Patient presents post op from cesarean section  1 week ago.  Subjective: She denies fever, chills, nausea and vomiting. Eating a regular diet without difficulty. Pain is controlled with current analgesics. Medications being used: narcotic analgesics including percocet (1-2 per day).  Activity: increasing slowly. She denies issues with her incision.    Objective: BP 114/78   Ht 4\' 11"  (1.499 m)   Wt 154 lb (69.9 kg)   LMP 12/13/2017 Comment: [redacted] weeks pregnant   BMI 31.10 kg/m   Constitutional: Well nourished, well developed female in no acute distress.  HEENT: normal Skin: Warm and dry.  Abdomen: s/nt/nd/ uterus at U-3 and firm clean, dry, intact and without erythema, induration, warmth, and tenderness Extremity: no edema   Assessment: 17 y.o. s/p cesarean section progressing well  Plan: Patient has done well after surgery with no apparent complications.  I have discussed the post-operative course to date, and the expected progress moving forward.  The patient understands what complications to be concerned about.    Activity plan: increase slowly  Return in about 5 weeks (around 11/04/2018) for Six Week Postpartum with Paragard IUD insertion.  Thomasene MohairStephen Asmaa Tirpak, MD 09/30/2018 12:28 PM

## 2018-10-01 NOTE — Telephone Encounter (Signed)
Noted. Will order to arrive by apt date/time. 

## 2018-11-04 ENCOUNTER — Other Ambulatory Visit (HOSPITAL_COMMUNITY)
Admission: RE | Admit: 2018-11-04 | Discharge: 2018-11-04 | Disposition: A | Discharge status: Home / Self Care | Payer: Medicaid Other | Source: Ambulatory Visit | Attending: Obstetrics and Gynecology | Admitting: Obstetrics and Gynecology

## 2018-11-04 ENCOUNTER — Ambulatory Visit (INDEPENDENT_AMBULATORY_CARE_PROVIDER_SITE_OTHER): Payer: Medicaid Other | Admitting: Obstetrics and Gynecology

## 2018-11-04 ENCOUNTER — Encounter: Payer: Self-pay | Admitting: Obstetrics and Gynecology

## 2018-11-04 DIAGNOSIS — Z30430 Encounter for insertion of intrauterine contraceptive device: Secondary | ICD-10-CM | POA: Diagnosis not present

## 2018-11-04 DIAGNOSIS — Z113 Encounter for screening for infections with a predominantly sexual mode of transmission: Secondary | ICD-10-CM | POA: Diagnosis present

## 2018-11-04 NOTE — Progress Notes (Signed)
Postpartum Visit  Chief Complaint:  Chief Complaint  Patient presents with  . 6 week post partum    History of Present Illness: Patient is a 18 y.o. G1P1001 presents for postpartum visit.  Date of delivery: 09/23/2018 Type of delivery: C-Section Episiotomy No.  Laceration: no Pregnancy or labor problems:  Acute blood loss anemia due to surgical blood loss, requiring transfusion of 2 units pRBCs.  Any problems since the delivery:  no  Newborn Details:  SINGLETON :  1. Baby's name: Mia Dalila. Birth weight: 7.7lb Maternal Details:  Breast Feeding:  yes Post partum depression/anxiety noted:  no Edinburgh Post-Partum Depression Score:  2  Date of last PAP:  N/a due to age  Past Medical History:  Diagnosis Date  . Medical history non-contributory     Past Surgical History:  Procedure Laterality Date  . CESAREAN SECTION N/A 09/23/2018   Procedure: CESAREAN SECTION;  Surgeon: Conard NovakJackson, Honestee Revard D, MD;  Location: [facility] ORS;  Service: Obstetrics;  Laterality: N/A;    Prior to Admission medications   Medication Sig Start Date End Date Taking? Authorizing Provider  Prenatal Vit-Fe Fumarate-FA (MULTIVITAMIN-PRENATAL) 27-0.8 MG TABS tablet Take 1 tablet by mouth daily at 12 noon.    [provider]    Allergies  Allergen Reactions  . Augmentin [Amoxicillin-Pot Clavulanate] Rash     Social History   Socioeconomic History  . Marital status: Single    Spouse name: Not on file  . Number of children: Not on file  . Years of education: Not on file  . Highest education level: Not on file  Occupational History  . Not on file  Social Needs  . Financial resource strain: Not hard at all  . Food insecurity:    Worry: Never true    Inability: Never true  . Transportation needs:    Medical: No    Non-medical: No  Tobacco Use  . Smoking status: Never Smoker  . Smokeless tobacco: Never Used  Substance and Sexual Activity  . Alcohol use: No  . Drug use: No  . Sexual  activity: Yes    Birth control/protection: I.U.D.  Lifestyle  . Physical activity:    Days per week: 0 days    Minutes per session: 0 min  . Stress: Not at all  Relationships  . Social connections:    Talks on phone: Three times a week    Gets together: Three times a week    Attends religious service: Never    Active member of club or organization: No    Attends meetings of clubs or organizations: Never    Relationship status: Not on file  . Intimate partner violence:    Fear of current or ex partner: Not on file    Emotionally abused: Not on file    Physically abused: Not on file    Forced sexual activity: Not on file  Other Topics Concern  . Not on file  Social History Narrative  . Not on file   Family History: denies history of gynecologic cancer  Review of Systems  Constitutional: Negative.   HENT: Negative.   Eyes: Negative.   Respiratory: Negative.   Cardiovascular: Negative.   Gastrointestinal: Negative.   Genitourinary: Negative.   Musculoskeletal: Negative.   Skin: Negative.   Neurological: Negative.   Psychiatric/Behavioral: Negative.      Physical Exam BP 114/70   Ht 4\' 10"  (1.473 m)   Wt 148 lb (67.1 kg)   LMP 12/13/2017 Comment: [redacted] weeks pregnant  BMI 30.93 kg/m   Physical Exam Constitutional:      General: She is not in acute distress.    Appearance: Normal appearance. She is well-developed.  Genitourinary:     Pelvic exam was performed with patient in the lithotomy position.     Vulva, inguinal canal, urethra, bladder, vagina, cervix, uterus, right adnexa and left adnexa normal.     No posterior fourchette injury or lesion present.     Uterus is anteverted.  HENT:     Head: Normocephalic and atraumatic.  Eyes:     General: No scleral icterus.    Conjunctiva/sclera: Conjunctivae normal.  Neck:     Musculoskeletal: Normal range of motion and neck supple.  Cardiovascular:     Rate and Rhythm: Normal rate and regular rhythm.  Pulmonary:      Effort: Pulmonary effort is normal. No respiratory distress.     Breath sounds: Normal breath sounds. No wheezing or rales.  Abdominal:     General: Bowel sounds are normal. There is no distension.     Palpations: Abdomen is soft. There is no mass.     Tenderness: There is no abdominal tenderness. There is no guarding or rebound.     Comments: Incision: without erythema, induration, warmth, and tenderness. It is clean, dry, and intact.    Musculoskeletal: Normal range of motion.  Neurological:     General: No focal deficit present.     Mental Status: She is alert and oriented to person, place, and time.     Cranial Nerves: No cranial nerve deficit.  Skin:    General: Skin is warm and dry.     Findings: No erythema.  Psychiatric:        Mood and Affect: Mood normal.        Behavior: Behavior normal.        Judgment: Judgment normal.     IUD Insertion Procedure Note  (Paragard) Patient identified, informed consent performed, consent signed.   Discussed risks of irregular bleeding, cramping, infection, malpositioning, expulsion or uterine perforation of the IUD (1:1000 placements)  which may require further procedure such as laparoscopy.  IUD while effective at preventing pregnancy do not prevent transmission of sexually transmitted diseases and use of barrier methods for this purpose was discussed. Time out was performed.  Urine pregnancy test negative.  Speculum placed in the vagina.  Cervix visualized.  Cleaned with Betadine x 2.  Grasped anteriorly with a single tooth tenaculum.  Uterus sounded to 9 cm. IUD placed per manufacturer's recommendations.  Strings trimmed to 3 cm. Tenaculum was removed, good hemostasis noted.  Patient tolerated procedure well.   Patient was given post-procedure instructions.  She was advised to have backup contraception for one week.    Female Chaperone present during breast and/or pelvic exam.  Assessment: 18 y.o. G1P1001 presenting for 6 week postpartum  visit  Plan: Problem List Items Addressed This Visit    None    Visit Diagnoses    Postpartum care and examination    -  Primary   Relevant Orders   Cervicovaginal ancillary only   Encounter for IUD insertion       Screen for STD (sexually transmitted disease)       Relevant Orders   Cervicovaginal ancillary only     1) Contraception Education given regarding options for contraception, including IUD placement.  2)  Pap - ASCCP guidelines and rational discussed.  Patient opts for routine screening interval. She is below the age of  initial pap smears  3) Patient underwent screening for postpartum depression with no concerns noted.  Return in about 4 weeks (around 12/02/2018) for IUD String check.  Thomasene Mohair, MD 11/04/2018 1:56 PM

## 2018-11-04 NOTE — Patient Instructions (Signed)
Intrauterine Device Insertion, Care After    This sheet gives you information about how to care for yourself after your procedure. Your health care provider may also give you more specific instructions. If you have problems or questions, contact your health care provider.  What can I expect after the procedure?  After the procedure, it is common to have:  · Cramps and pain in the abdomen.  · Light bleeding (spotting) or heavier bleeding that is like your menstrual period. This may last for up to a few days.  · Lower back pain.  · Dizziness.  · Headaches.  · Nausea.  Follow these instructions at home:  · Before resuming sexual activity, check to make sure that you can feel the IUD string(s). You should be able to feel the end of the string(s) below the opening of your cervix. If your IUD string is in place, you may resume sexual activity.  ? If you had a hormonal IUD inserted more than 7 days after your most recent period started, you will need to use a backup method of birth control for 7 days after IUD insertion. Ask your health care provider whether this applies to you.  · Continue to check that the IUD is still in place by feeling for the string(s) after every menstrual period, or once a month.  · Take over-the-counter and prescription medicines only as told by your health care provider.  · Do not drive or use heavy machinery while taking prescription pain medicine.  · Keep all follow-up visits as told by your health care provider. This is important.  Contact a health care provider if:  · You have bleeding that is heavier or lasts longer than a normal menstrual cycle.  · You have a fever.  · You have cramps or abdominal pain that get worse or do not get better with medicine.  · You develop abdominal pain that is new or is not in the same area of earlier cramping and pain.  · You feel lightheaded or weak.  · You have abnormal or bad-smelling discharge from your vagina.  · You have pain during sexual  activity.  · You have any of the following problems with your IUD string(s):  ? The string bothers or hurts you or your sexual partner.  ? You cannot feel the string.  ? The string has gotten longer.  · You can feel the IUD in your vagina.  · You think you may be pregnant, or you miss your menstrual period.  · You think you may have an STI (sexually transmitted infection).  Get help right away if:  · You have flu-like symptoms.  · You have a fever and chills.  · You can feel that your IUD has slipped out of place.  Summary  · After the procedure, it is common to have cramps and pain in the abdomen. It is also common to have light bleeding (spotting) or heavier bleeding that is like your menstrual period.  · Continue to check that the IUD is still in place by feeling for the string(s) after every menstrual period, or once a month.  · Keep all follow-up visits as told by your health care provider. This is important.  · Contact your health care provider if you have problems with your IUD string(s), such as the string getting longer or bothering you or your sexual partner.  This information is not intended to replace advice given to you by your health care provider. Make   sure you discuss any questions you have with your health care provider.  Document Released: 06/06/2011 Document Revised: 08/29/2016 Document Reviewed: 08/29/2016  Elsevier Interactive Patient Education © 2019 [employer]

## 2018-11-06 LAB — CERVICOVAGINAL ANCILLARY ONLY
Chlamydia: NEGATIVE
Neisseria Gonorrhea: NEGATIVE

## 2018-12-04 ENCOUNTER — Ambulatory Visit: Payer: Medicaid Other | Admitting: Obstetrics and Gynecology

## 2019-06-02 ENCOUNTER — Ambulatory Visit (LOCAL_COMMUNITY_HEALTH_CENTER): Payer: Self-pay

## 2019-06-02 ENCOUNTER — Other Ambulatory Visit: Payer: Self-pay

## 2019-06-02 DIAGNOSIS — Z111 Encounter for screening for respiratory tuberculosis: Secondary | ICD-10-CM

## 2019-06-05 ENCOUNTER — Other Ambulatory Visit: Payer: Self-pay

## 2019-06-05 ENCOUNTER — Ambulatory Visit (LOCAL_COMMUNITY_HEALTH_CENTER): Payer: Medicaid Other

## 2019-06-05 DIAGNOSIS — Z111 Encounter for screening for respiratory tuberculosis: Secondary | ICD-10-CM

## 2019-06-05 LAB — TB SKIN TEST
Induration: 0 mm
TB Skin Test: NEGATIVE

## 2019-06-11 ENCOUNTER — Other Ambulatory Visit: Payer: Self-pay

## 2019-06-11 DIAGNOSIS — Z20822 Contact with and (suspected) exposure to covid-19: Secondary | ICD-10-CM

## 2019-06-12 LAB — NOVEL CORONAVIRUS, NAA: SARS-CoV-2, NAA: DETECTED — AB

## 2019-11-08 IMAGING — US US OB TRANSVAGINAL
1 series · 14 of 28 positions shown · non-contrast
Comparison: None.

CLINICAL DATA: Initial evaluation for vaginal spotting, early
pregnancy.

EXAM:
OBSTETRIC <14 WK US AND TRANSVAGINAL OB US
TECHNIQUE: Both transabdominal and transvaginal ultrasound examinations were
performed for complete evaluation of the gestation as well as the
maternal uterus, adnexal regions, and pelvic cul-de-sac.
Transvaginal technique was performed to assess early pregnancy.

[Series 1: us ob transvaginal · 0.22mm/px · 14 of 97 slices shown]
[im 4/97]
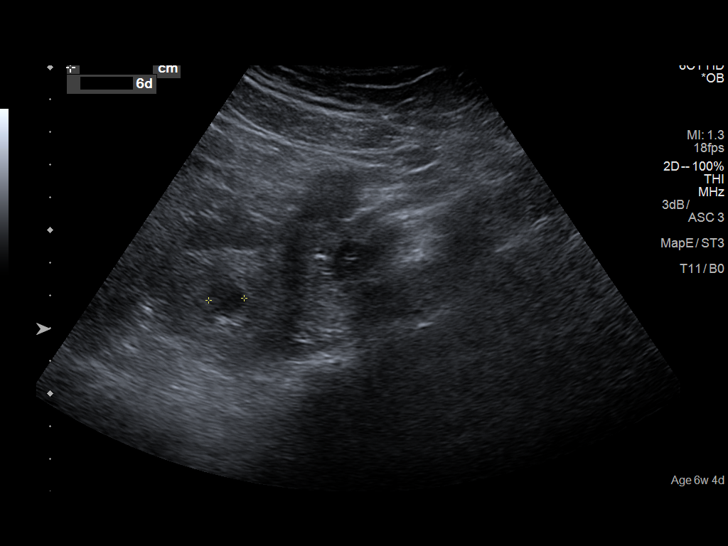
[im 11/97]
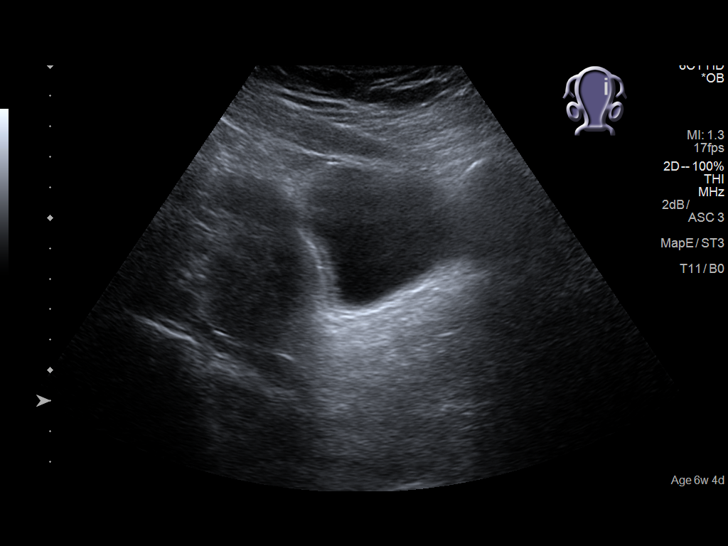
[im 18/97]
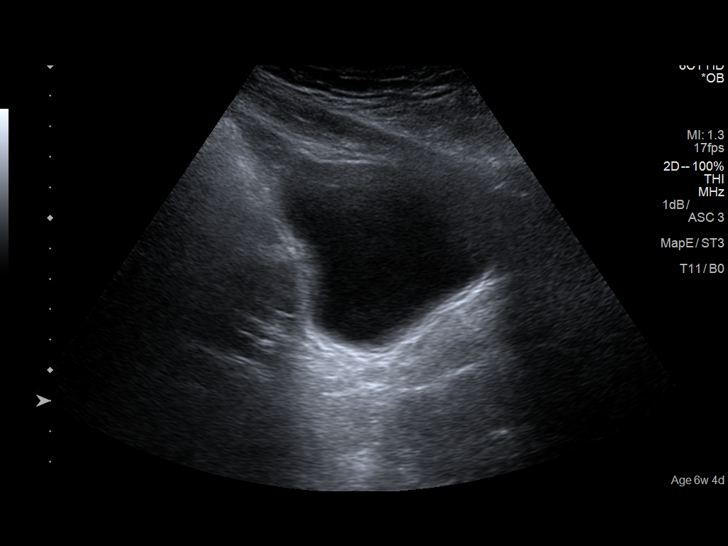
[im 25/97]
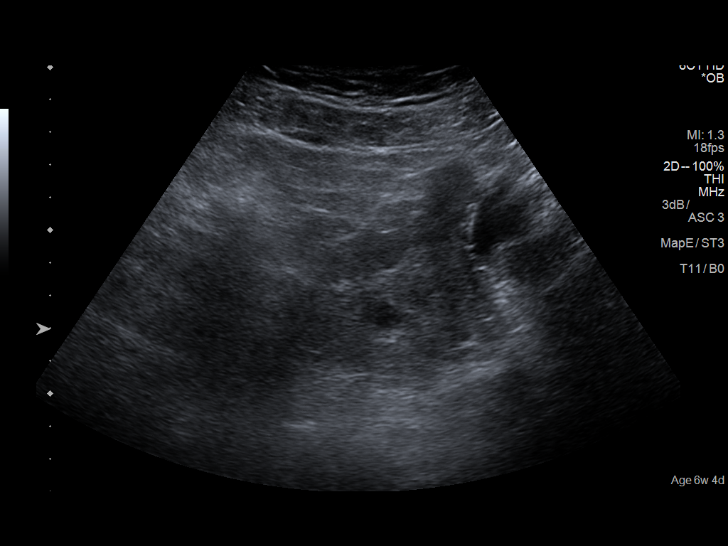
[im 33/97]
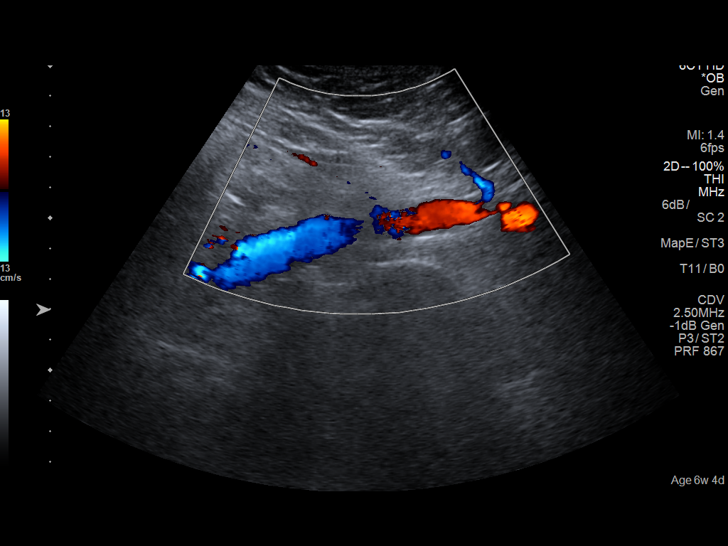
[im 40/97]
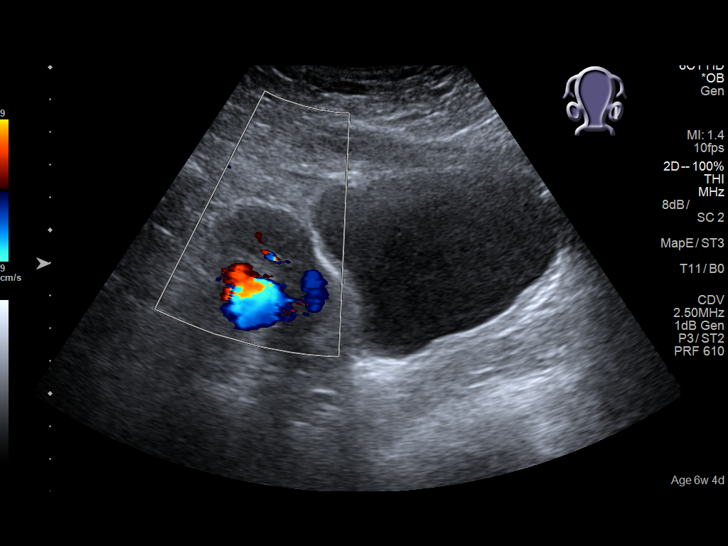
[im 47/97]
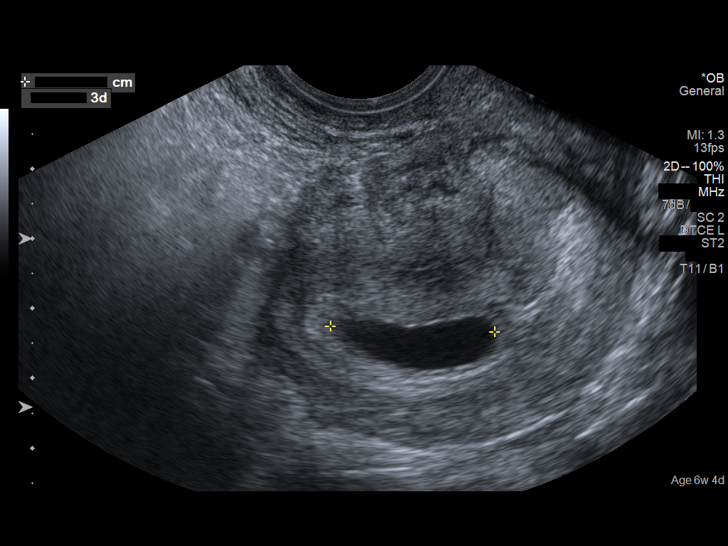
[im 54/97]
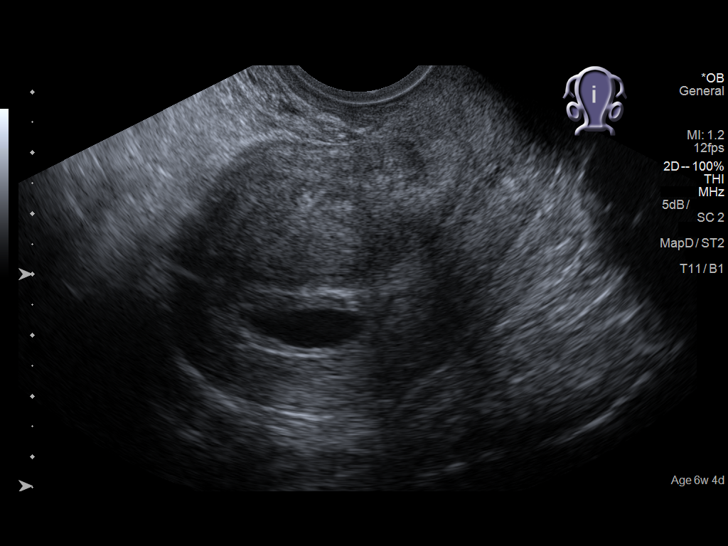
[im 61/97]
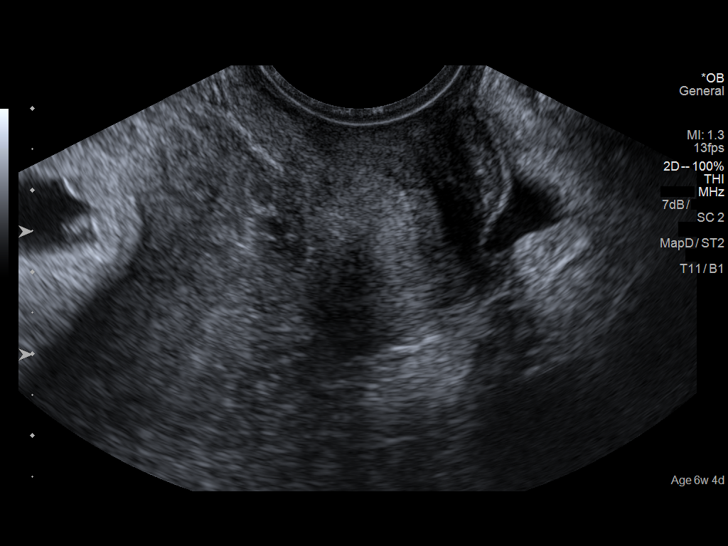
[im 68/97]
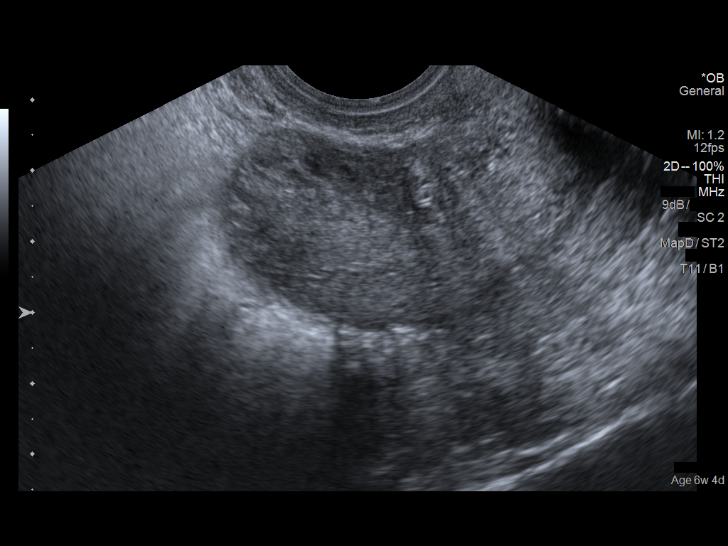
[im 75/97]
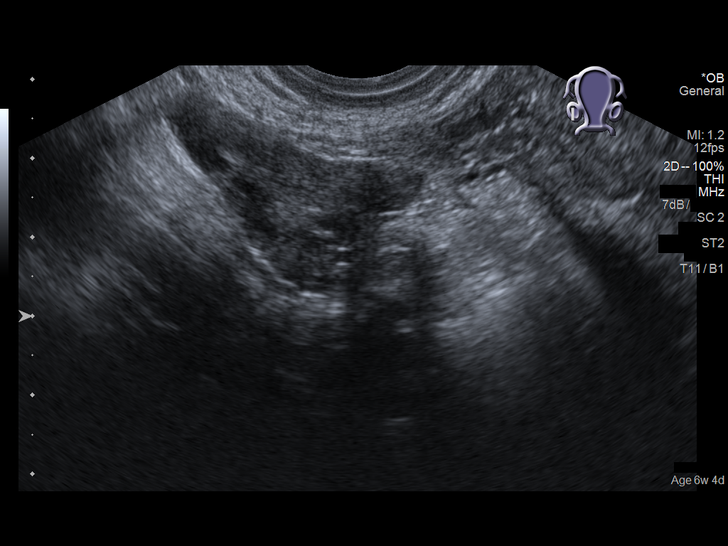
[im 82/97]
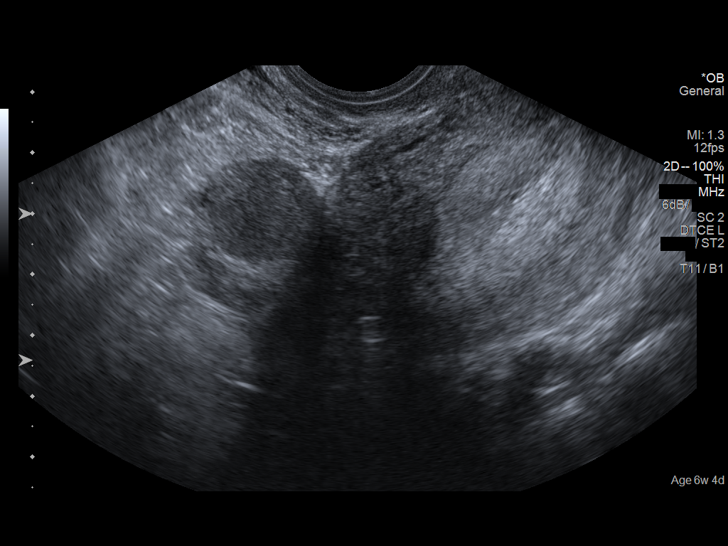
[im 89/97]
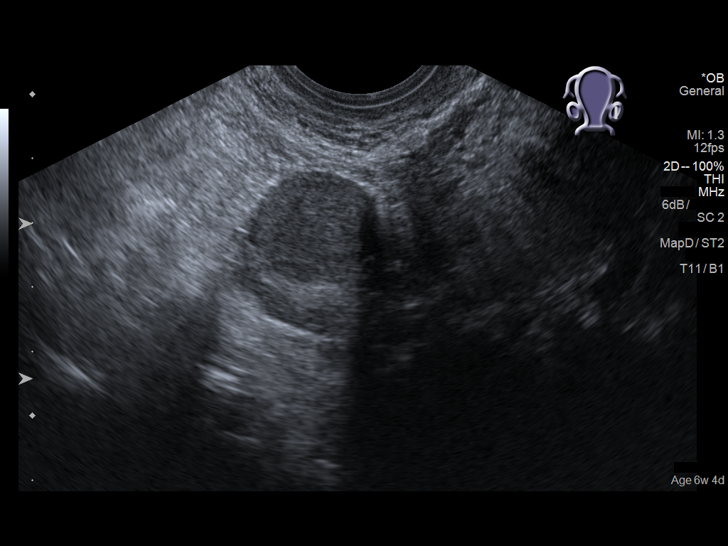
[im 97/97]
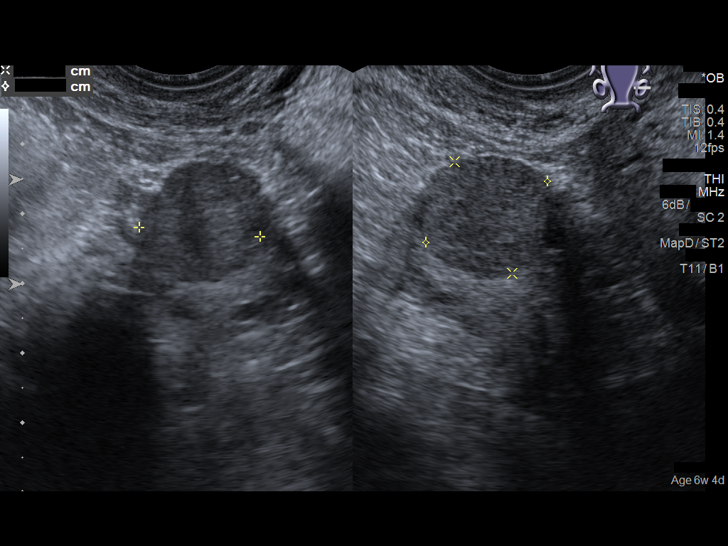

[14 of 28 positions shown; findings below may reference images not displayed]

FINDINGS: Intrauterine gestational sac: Single

Yolk sac:  Present

Embryo:  Present

Cardiac Activity: Present

Heart Rate: 101 bpm

CRL: 2.4 mm   5 w   6 d                  US EDC: 09/24/2018

Subchorionic hemorrhage:  None visualized.

Maternal uterus/adnexae: Ovaries are normal in appearance
bilaterally. Small corpus luteal cyst noted within the left ovary.
Trace free physiologic fluid noted within the pelvis.
IMPRESSION: 1. Single viable intrauterine pregnancy as above without
complication, estimated gestational age 5 weeks and 6 days by
sonography.
2. No other acute maternal uterine or adnexal abnormality
identified.

## 2019-12-30 NOTE — Telephone Encounter (Signed)
Paragard rcvd/charged 11/04/2018

## 2020-11-30 ENCOUNTER — Ambulatory Visit
Admission: EM | Admit: 2020-11-30 | Discharge: 2020-11-30 | Disposition: A | Discharge status: Home / Self Care | Payer: Medicaid Other | Attending: Physician Assistant | Admitting: Physician Assistant

## 2020-11-30 ENCOUNTER — Other Ambulatory Visit: Payer: Self-pay

## 2020-11-30 DIAGNOSIS — R0602 Shortness of breath: Secondary | ICD-10-CM | POA: Insufficient documentation

## 2020-11-30 DIAGNOSIS — Z881 Allergy status to other antibiotic agents status: Secondary | ICD-10-CM | POA: Insufficient documentation

## 2020-11-30 DIAGNOSIS — Z20822 Contact with and (suspected) exposure to covid-19: Secondary | ICD-10-CM | POA: Diagnosis not present

## 2020-11-30 DIAGNOSIS — R438 Other disturbances of smell and taste: Secondary | ICD-10-CM | POA: Diagnosis not present

## 2020-11-30 DIAGNOSIS — Z79899 Other long term (current) drug therapy: Secondary | ICD-10-CM | POA: Insufficient documentation

## 2020-11-30 DIAGNOSIS — Z7951 Long term (current) use of inhaled steroids: Secondary | ICD-10-CM | POA: Insufficient documentation

## 2020-11-30 DIAGNOSIS — Z880 Allergy status to penicillin: Secondary | ICD-10-CM | POA: Insufficient documentation

## 2020-11-30 DIAGNOSIS — B349 Viral infection, unspecified: Secondary | ICD-10-CM | POA: Diagnosis present

## 2020-11-30 DIAGNOSIS — R0981 Nasal congestion: Secondary | ICD-10-CM | POA: Diagnosis not present

## 2020-11-30 DIAGNOSIS — R439 Unspecified disturbances of smell and taste: Secondary | ICD-10-CM | POA: Insufficient documentation

## 2020-11-30 MED ORDER — LORATADINE 10 MG PO TABS: 10.0000 mg | ORAL_TABLET | Freq: Every day | ORAL | 0 refills | Status: DC

## 2020-11-30 MED ORDER — ALBUTEROL SULFATE HFA 108 (90 BASE) MCG/ACT IN AERS: 1.0000 | INHALATION_SPRAY | Freq: Four times a day (QID) | RESPIRATORY_TRACT | 0 refills | Status: DC | PRN

## 2020-11-30 NOTE — ED Triage Notes (Signed)
Pt reports having loss of taste and smell since Monday. Also reports having a headache.  No known covid exposure.

## 2020-11-30 NOTE — Discharge Instructions (Signed)

## 2020-11-30 NOTE — ED Provider Notes (Signed)
[facility]    CSN: [id number] Arrival date & time: 11/30/20  1216      History   Chief Complaint Chief Complaint  Patient presents with  . Covid Symptoms    HPI Brenda Mcclure is a 20 y.o. female presenting for 2-day history of loss of taste and smell, headaches, fatigue, nasal congestion/rhinorrhea and occasional difficulty breathing.  Patient states symptoms are similar to when she had allergy flareups in the past.  Patient states that she has had to use an albuterol inhaler with her significant allergies in the past.  She denies any history of asthma.  No known exposure to COVID-19.  She has been fully vaccinated for COVID-19.  Patient denies any fevers, weakness, cough, sore throat, diarrhea or vomiting.  No other complaints or concerns.  HPI  Past Medical History:  Diagnosis Date  . Medical history non-contributory   . Ovarian cyst     Patient Active Problem List   Diagnosis Date Noted  . Status post cesarean delivery 09/23/2018  . Chorioamnionitis 09/23/2018  . Labor and delivery, indication for care 09/22/2018  . [redacted] weeks gestation of pregnancy 09/21/2018  . Braxton Hicks contractions 09/21/2018    Past Surgical History:  Procedure Laterality Date  . CESAREAN SECTION N/A 09/23/2018   Procedure: CESAREAN SECTION;  Surgeon: Conard Novak, MD;  Location: [facility] ORS;  Service: Obstetrics;  Laterality: N/A;    OB History    Gravida  1   Para  1   Term  1   Preterm      AB      Living  1     SAB      IAB      Ectopic      Multiple  0   Live Births  1            Home Medications    Prior to Admission medications   Medication Sig Start Date End Date Taking? Authorizing Provider  albuterol (VENTOLIN HFA) 108 (90 Base) MCG/ACT inhaler Inhale 1-2 puffs into the lungs every 6 (six) hours as needed for wheezing or shortness of breath. 11/30/20 11/30/21 Yes Shirlee Latch, PA-C  loratadine (CLARITIN) 10 MG tablet Take 1 tablet (10 mg  total) by mouth daily. 11/30/20 12/30/20 Yes Shirlee Latch, PA-C  Prenatal Vit-Fe Fumarate-FA (MULTIVITAMIN-PRENATAL) 27-0.8 MG TABS tablet Take 1 tablet by mouth daily at 12 noon.    [provider]    Family History Family History  Problem Relation Age of Onset  . Hypertension Mother   . Asthma Sister   . Depression Maternal Aunt   . Bipolar disorder Maternal Aunt   . Hypertension Maternal Grandmother   . Hyperlipidemia Maternal Grandmother     Social History Social History   Tobacco Use  . Smoking status: Never Smoker  . Smokeless tobacco: Never Used  Vaping Use  . Vaping Use: Never used  Substance Use Topics  . Alcohol use: No  . Drug use: No     Allergies   Pineapple and Augmentin [amoxicillin-pot clavulanate]   Review of Systems Review of Systems  Constitutional: Positive for fatigue. Negative for chills, diaphoresis and fever.  HENT: Positive for congestion and rhinorrhea. Negative for ear pain, sinus pressure, sinus pain and sore throat.        Loss of smell and taste  Respiratory: Positive for shortness of breath. Negative for cough.   Cardiovascular: Negative for chest pain.  Gastrointestinal: Negative for abdominal pain,  nausea and vomiting.  Musculoskeletal: Negative for arthralgias and myalgias.  Skin: Negative for rash.  Neurological: Positive for headaches. Negative for weakness.  Hematological: Negative for adenopathy.     Physical Exam Triage Vital Signs ED Triage Vitals  Enc Vitals Group     BP 11/30/20 1232 107/74     Pulse Rate 11/30/20 1232 71     Resp 11/30/20 1232 18     Temp 11/30/20 1232 98.1 F (36.7 C)     Temp Source 11/30/20 1232 Oral     SpO2 11/30/20 1232 100 %     Weight 11/30/20 1233 146 lb (66.2 kg)     Height 11/30/20 1233 4\' 10"  (1.473 m)     Head Circumference --      Peak Flow --      Pain Score 11/30/20 1233 7     Pain Loc --      Pain Edu? --      Excl. in GC? --    No data found.  Updated Vital  Signs BP 107/74   Pulse 71   Temp 98.1 F (36.7 C) (Oral)   Resp 18   Ht 4\' 10"  (1.473 m)   Wt 146 lb (66.2 kg)   SpO2 100%   BMI 30.51 kg/m       Physical Exam Vitals and nursing note reviewed.  Constitutional:      General: She is not in acute distress.    Appearance: Normal appearance. She is not ill-appearing or toxic-appearing.  HENT:     Head: Normocephalic and atraumatic.     Nose: Congestion and rhinorrhea present.     Mouth/Throat:     Mouth: Mucous membranes are moist.     Pharynx: Oropharynx is clear.  Eyes:     General: No scleral icterus.       Right eye: No discharge.        Left eye: No discharge.     Conjunctiva/sclera: Conjunctivae normal.  Cardiovascular:     Rate and Rhythm: Normal rate and regular rhythm.     Heart sounds: Normal heart sounds.  Pulmonary:     Effort: Pulmonary effort is normal. No respiratory distress.     Breath sounds: Normal breath sounds.  Musculoskeletal:     Cervical back: Neck supple.  Skin:    General: Skin is dry.  Neurological:     General: No focal deficit present.     Mental Status: She is alert. Mental status is at baseline.     Motor: No weakness.     Gait: Gait normal.  Psychiatric:        Mood and Affect: Mood normal.        Behavior: Behavior normal.        Thought Content: Thought content normal.      UC Treatments / Results  Labs (all labs ordered are listed, but only abnormal results are displayed) Labs Reviewed  SARS CORONAVIRUS 2 (TAT 6-24 HRS)    EKG   Radiology No results found.  Procedures Procedures (including critical care time)  Medications Ordered in UC Medications - No data to display  Initial Impression / Assessment and Plan / UC Course  I have reviewed the triage vital signs and the nursing notes.  Pertinent labs & imaging results that were available during my care of the patient were reviewed by me and considered in my medical decision making (see chart for details).   In  the clinic all vital signs normal  and stable patient is in no acute distress.  Send out COVID-19 testing performed.  Advised patient that it is possible she could have COVID-19 specialist that she has loss of smell and taste.  Current CDC guidelines, isolation protocol and ED precautions reviewed with patient.  Advised supportive care with increasing rest and fluids and following up with [place] as needed.  I did send a prescription for albuterol inhaler and Claritin to the pharmacy since patient believes some of her symptoms could be related to allergies and those medications is helped her in the past.   Final Clinical Impressions(s) / UC Diagnoses   Final diagnoses:  Viral illness  Disturbance of smell and taste  Shortness of breath  Nasal congestion     Discharge Instructions     You have received COVID testing today either for positive exposure, concerning symptoms that could be related to COVID infection, screening purposes, or re-testing after confirmed positive.  Your test obtained today checks for active viral infection in the last 1-2 weeks. If your test is negative now, you can still test positive later. So, if you do develop symptoms you should either get re-tested and/or isolate x 5 days and then strict mask use x 5 days (unvaccinated) or mask use x 10 days (vaccinated). Please follow CDC guidelines.  While Rapid antigen tests come back in 15-20 minutes, send out PCR/molecular test results typically come back within 1-3 days. In the mean time, if you are symptomatic, assume this could be a positive test and treat/monitor yourself as if you do have COVID.   We will call with test results if positive. Please download the MyChart app and set up a profile to access test results.   If symptomatic, go home and rest. Push fluids. Take Tylenol as needed for discomfort. Gargle warm salt water. Throat lozenges. Take Mucinex DM or Robitussin for cough. Humidifier in bedroom to ease coughing. Warm  showers. Also review the COVID handout for more information.  COVID-19 INFECTION: The incubation period of COVID-19 is approximately 14 days after exposure, with most symptoms developing in roughly 4-5 days. Symptoms may range in severity from mild to critically severe. Roughly 80% of those infected will have mild symptoms. People of any age may become infected with COVID-19 and have the ability to transmit the virus. The most common symptoms include: fever, fatigue, cough, body aches, headaches, sore throat, nasal congestion, shortness of breath, nausea, vomiting, diarrhea, changes in smell and/or taste.    COURSE OF ILLNESS Some patients may begin with mild disease which can progress quickly into critical symptoms. If your symptoms are worsening please call ahead to the Emergency Department and proceed there for further treatment. Recovery time appears to be roughly 1-2 weeks for mild symptoms and 3-6 weeks for severe disease.   GO IMMEDIATELY TO ER FOR FEVER YOU ARE UNABLE TO GET DOWN WITH TYLENOL, BREATHING PROBLEMS, CHEST PAIN, FATIGUE, LETHARGY, INABILITY TO EAT OR DRINK, ETC  QUARANTINE AND ISOLATION: To help decrease the spread of COVID-19 please remain isolated if you have COVID infection or are highly suspected to have COVID infection. This means -stay home and isolate to one room in the home if you live with others. Do not share a bed or bathroom with others while ill, sanitize and wipe down all countertops and keep common areas clean and disinfected. Stay home for 5 days. If you have no symptoms or your symptoms are resolving after 5 days, you can leave your house. Continue to wear  a mask around others for 5 additional days. If you have been in close contact (within 6 feet) of someone diagnosed with COVID 19, you are advised to quarantine in your home for 14 days as symptoms can develop anywhere from 2-14 days after exposure to the virus. If you develop symptoms, you  must isolate.  Most  current guidelines for COVID after exposure -unvaccinated: isolate 5 days and strict mask use x 5 days. Test on day 5 is possible -vaccinated: wear mask x 10 days if symptoms do not develop -You do not necessarily need to be tested for COVID if you have + exposure and  develop symptoms. Just isolate at home x10 days from symptom onset During this global pandemic, CDC advises to practice social distancing, try to stay at least 60ft away from others at all times. Wear a face covering. Wash and sanitize your hands regularly and avoid going anywhere that is not necessary.  KEEP IN MIND THAT THE COVID TEST IS NOT 100% ACCURATE AND YOU SHOULD STILL DO EVERYTHING TO PREVENT POTENTIAL SPREAD OF VIRUS TO OTHERS (WEAR MASK, WEAR GLOVES, WASH HANDS AND SANITIZE REGULARLY). IF INITIAL TEST IS NEGATIVE, THIS MAY NOT MEAN YOU ARE DEFINITELY NEGATIVE. MOST ACCURATE TESTING IS DONE 5-7 DAYS AFTER EXPOSURE.   It is not advised by CDC to get re-tested after receiving a positive COVID test since you can still test positive for weeks to months after you have already cleared the virus.   *If you have not been vaccinated for COVID, I strongly suggest you consider getting vaccinated as long as there are no contraindications.      ED Prescriptions    Medication Sig Dispense Auth. Provider   albuterol (VENTOLIN HFA) 108 (90 Base) MCG/ACT inhaler Inhale 1-2 puffs into the lungs every 6 (six) hours as needed for wheezing or shortness of breath. 1 g Eusebio Friendly B, PA-C   loratadine (CLARITIN) 10 MG tablet Take 1 tablet (10 mg total) by mouth daily. 30 tablet Gareth Morgan     PDMP not reviewed this encounter.   Shirlee Latch, PA-C 11/30/20 1402

## 2020-12-01 LAB — SARS CORONAVIRUS 2 (TAT 6-24 HRS): SARS Coronavirus 2: NEGATIVE

## 2021-01-27 ENCOUNTER — Ambulatory Visit: Payer: Self-pay

## 2021-01-30 ENCOUNTER — Other Ambulatory Visit: Payer: Self-pay

## 2021-01-30 ENCOUNTER — Ambulatory Visit: Payer: Self-pay

## 2021-01-30 ENCOUNTER — Encounter: Payer: Self-pay | Admitting: Family Medicine

## 2021-01-30 ENCOUNTER — Ambulatory Visit (LOCAL_COMMUNITY_HEALTH_CENTER): Payer: Medicaid Other | Admitting: Family Medicine

## 2021-01-30 VITALS — BP 118/74 | Ht <= 58 in | Wt 147.2 lb

## 2021-01-30 DIAGNOSIS — Z3009 Encounter for other general counseling and advice on contraception: Secondary | ICD-10-CM

## 2021-01-30 DIAGNOSIS — E669 Obesity, unspecified: Secondary | ICD-10-CM | POA: Insufficient documentation

## 2021-01-30 DIAGNOSIS — Z113 Encounter for screening for infections with a predominantly sexual mode of transmission: Secondary | ICD-10-CM

## 2021-01-30 DIAGNOSIS — F321 Major depressive disorder, single episode, moderate: Secondary | ICD-10-CM

## 2021-01-30 DIAGNOSIS — N644 Mastodynia: Secondary | ICD-10-CM

## 2021-01-30 DIAGNOSIS — Z01419 Encounter for gynecological examination (general) (routine) without abnormal findings: Secondary | ICD-10-CM

## 2021-01-30 MED ORDER — SERTRALINE HCL 25 MG PO TABS: 25.0000 mg | ORAL_TABLET | Freq: Every day | ORAL | 3 refills | Status: DC

## 2021-01-30 NOTE — Progress Notes (Signed)
[facility] DEPARTMENT [facility] [address] Main Number: [phone]    Family Planning Visit- Initial Visit  Subjective:  Brenda Mcclure is a 20 y.o.  G1P1001   being seen today for an initial annual visit and to discuss contraceptive options.  The patient is currently using IUD Paragard for pregnancy prevention. Patient reports she does not want a pregnancy in the next year.  Patient has the following medical conditions has Status post cesarean delivery and Mildly obese on their problem list.  Chief Complaint  Patient presents with  . Annual Exam    Patient reports* Left breast discomfort 2 month ago, worsening. Feels tender under arm.  No currently nursing. No pumping  Patient denies family history of breast cancer   Body mass index is 30.76 kg/m. - Patient is eligible for diabetes screening based on BMI and age >21?  no HA1C ordered? no  Patient reports 1  partner/s in last year. Desires STI screening?  Yes  Has patient been screened once for HCV in the past?  No  No results found for: HCVAB  Does the patient have current drug use (including MJ), have a partner with drug use, and/or has been incarcerated since last result? No  If yes-- Screen for HCV through [facility]   Does the patient meet criteria for HBV testing? No  Criteria:  -Household, sexual or needle sharing contact with HBV -History of drug use -HIV positive -Those with known Hep C   Health Maintenance Due  Topic Date Due  . Hepatitis C Screening  Never done  . COVID-19 Vaccine (1) Never done  . HPV VACCINES (1 - 2-dose series) Never done  . TETANUS/TDAP  Never done  . CHLAMYDIA SCREENING  11/05/2019  . PAP-Cervical Cytology Screening  Never done  . PAP SMEAR-Modifier  Never done    Review of Systems  Constitutional: Negative for chills and fever.  Eyes: Negative for blurred vision and double vision.  Respiratory: Negative for cough and shortness  of breath.   Cardiovascular: Negative for chest pain and orthopnea.  Gastrointestinal: Negative for nausea and vomiting.  Genitourinary: Negative for dysuria, flank pain and frequency.  Musculoskeletal: Negative for myalgias.  Skin: Negative for rash.  Neurological: Negative for dizziness, tingling, weakness and headaches.  Endo/Heme/Allergies: Does not bruise/bleed easily.  Psychiatric/Behavioral: Negative for depression and suicidal ideas. The patient is not nervous/anxious.     The following portions of the patient's history were reviewed and updated as appropriate: allergies, current medications, past family history, past medical history, past social history, past surgical history and problem list. Problem list updated.   See flowsheet for other program required questions.  Objective:   Vitals:   01/30/21 1413  BP: 118/74  Weight: 147 lb 3.2 oz (66.8 kg)  Height: 4\' 10"  (1.473 m)    Physical Exam Vitals and nursing note reviewed.  Constitutional:      Appearance: She is well-developed.  HENT:     Head: Normocephalic and atraumatic.  Eyes:     Conjunctiva/sclera: Conjunctivae normal.  Cardiovascular:     Rate and Rhythm: Normal rate.  Pulmonary:     Effort: Pulmonary effort is normal.  Chest:     Chest wall: No mass, swelling or tenderness.  Breasts:     Right: Normal. No swelling, inverted nipple, mass, nipple discharge, tenderness, axillary adenopathy or supraclavicular adenopathy.     Left: Mass (<0.5cm cyst like at out edge of left breast 1  oclock) and tenderness (only over this area described above) present. No swelling, inverted nipple, nipple discharge, axillary adenopathy or supraclavicular adenopathy.      Comments: Fibrocystic breast tissue throughout  Abdominal:     Palpations: Abdomen is soft.     Tenderness: There is no abdominal tenderness.  Musculoskeletal:        General: Normal range of motion.     Cervical back: Normal range of motion and neck  supple.  Lymphadenopathy:     Upper Body:     Right upper body: No supraclavicular, axillary or pectoral adenopathy.     Left upper body: No supraclavicular or axillary adenopathy.  Skin:    General: Skin is warm and dry.     Findings: No erythema.  Neurological:     Mental Status: She is alert and oriented to person, place, and time.       Assessment and Plan:  Brenda Mcclure is a 20 y.o. female presenting to the [facility] Department for an initial annual wellness/contraceptive visit  Contraception counseling: Reviewed all forms of birth control options in the tiered based approach. available including abstinence; over the counter/barrier methods; hormonal contraceptive medication including pill, patch, ring, injection,contraceptive implant, ECP; hormonal and nonhormonal IUDs; permanent sterilization options including vasectomy and the various tubal sterilization modalities. Risks, benefits, and typical effectiveness rates were reviewed.  Questions were answered.  Written information was also given to the patient to review.  Patient desires IUD- has in place, this was prescribed for patient. She will follow up in  1 year for surveillance.  She was told to call with any further questions, or with any concerns about this method of contraception.  Emphasized use of condoms 100% of the time for STI prevention.   1. Current moderate episode of major depressive disorder without prior episode (HCC) Concerning sx. Consistent with new onset depression. We discussed options for starting med and counseling vs counseling alone. Patient would strongly like to start medication as she is "just not the mom she wants to be"  - Patient will establish primary care- RN to given list of local providers - Reviewed that Zoloft is pregnancy safe and can remain on this for future pregnancy.  - Chart routed to LCSW for review and due to referral. Patient given LCSW card and emergency services card -  sertraline (ZOLOFT) 25 MG tablet; Take 1 tablet (25 mg total) by mouth daily.  Dispense: 30 tablet; Refill: 3 - Ambulatory referral to Behavioral Health  2. Screening examination for venereal disease - Chlamydia/Gonorrhea [facility] - HIV/HCV [facility] - Syphilis Serology,  Lab  3. Well woman exam with routine gynecological exam Pap smear today Recommended daily vitamin Reviewed HM due to day No [facility] available but patient declines vaccinations today  4. Left breast pain Consistent with fibrocystic, benign breast cyst Recommended watchful waiting Use of NSAIDs as needed.  If still present in 2-3 months return for evaluation and this would trigger possible [place] or mammogram   Return in about 3 months (around 05/01/2021) for Depression w/ PCP.  No future appointments.  Federico Flake, MD

## 2021-01-30 NOTE — Progress Notes (Signed)
Pt states she is here for annual exam, currently using Paragard IUD and is happy with it, desires STD checks (recently divorced), and has uncomfortable feeling in left breast and more painful when she presses on certain area - desires breast exam.

## 2021-02-06 LAB — HM HIV SCREENING LAB: HM HIV Screening: NEGATIVE

## 2021-02-07 LAB — IGP, RFX APTIMA HPV ASCU: PAP Smear Comment: 0

## 2021-02-10 ENCOUNTER — Encounter: Payer: Self-pay | Admitting: Nurse Practitioner

## 2021-03-26 ENCOUNTER — Ambulatory Visit
Admission: EM | Admit: 2021-03-26 | Discharge: 2021-03-26 | Disposition: A | Discharge status: Home / Self Care | Payer: Medicaid Other | Attending: Emergency Medicine | Admitting: Emergency Medicine

## 2021-03-26 ENCOUNTER — Encounter: Payer: Self-pay | Admitting: Emergency Medicine

## 2021-03-26 ENCOUNTER — Other Ambulatory Visit: Payer: Self-pay

## 2021-03-26 DIAGNOSIS — R040 Epistaxis: Secondary | ICD-10-CM

## 2021-03-26 MED ORDER — IPRATROPIUM BROMIDE 0.06 % NA SOLN: 2.0000 | Freq: Four times a day (QID) | NASAL | 12 refills | Status: DC

## 2021-03-26 NOTE — ED Triage Notes (Addendum)
Patient states that she had a nose bleed that started at 6am this morning and lasted 1 hour and then 15 min later started bleeding again for another hour.  Patient states that her nose bleed has currently stopped. Patient states that she was at [facility] yesterday and hit her right forehead on the safety harness on a roller coaster yesterday.  Patient reports HA and head feeling foggy yesterday. Patient denies LOC.  Patient denies any pain at this time.

## 2021-03-26 NOTE — ED Provider Notes (Addendum)
[facility]    CSN: [id number] Arrival date & time: 03/26/21  0801      History   Chief Complaint Chief Complaint  Patient presents with  . Epistaxis    HPI Brenda Mcclure is a 20 y.o. female.   HPI   20 year old female here for evaluation of nosebleed.  Patient reports that she woke up at 0 600 and developed a nosebleed from her left nostril.  She reports that the nosebleed lasted an hour, quit for 15 minutes, and then restarted for another hour.  Patient was not bleeding when she came to the urgent care but is since resumed bleeding from her left nostril.  Patient reports that she was at [employer] yesterday and struck the right side of her head on the safety harness while on a wooden roller coaster.  She states that she did not have any loss of consciousness but she has had intermittent right-sided headache and dizziness.  She also reports intermittent blurry vision.  Patient denies any nausea or vomiting, chest pain or shortness of breath, or passing out.  Patient does use Flonase for nasal allergies but has not used it in 2 days.  Patient is also been experiencing nasal congestion that predates her nosebleed.  Past Medical History:  Diagnosis Date  . Medical history non-contributory   . Ovarian cyst     Patient Active Problem List   Diagnosis Date Noted  . Mildly obese 01/30/2021  . Status post cesarean delivery 09/23/2018    Past Surgical History:  Procedure Laterality Date  . CESAREAN SECTION N/A 09/23/2018   Procedure: CESAREAN SECTION;  Surgeon: Conard Novak, MD;  Location: [facility] ORS;  Service: Obstetrics;  Laterality: N/A;    OB History    Gravida  1   Para  1   Term  1   Preterm  0   AB  0   Living  1     SAB  0   IAB  0   Ectopic  0   Multiple  0   Live Births  1            Home Medications    Prior to Admission medications   Medication Sig Start Date End Date Taking? Authorizing Provider  ipratropium (ATROVENT)  0.06 % nasal spray Place 2 sprays into both nostrils 4 (four) times daily. 03/26/21  Yes Becky Augusta, NP  loratadine (CLARITIN) 10 MG tablet Take 1 tablet (10 mg total) by mouth daily. 11/30/20 12/30/20 Yes Eusebio Friendly B, PA-C  PARAGARD INTRAUTERINE COPPER IU by Intrauterine route. Placed Jan 2021 10/23/19  Yes [provider]  albuterol (VENTOLIN HFA) 108 (90 Base) MCG/ACT inhaler Inhale 1-2 puffs into the lungs every 6 (six) hours as needed for wheezing or shortness of breath. Patient not taking: Reported on 01/30/2021 11/30/20 11/30/21  Shirlee Latch, PA-C  Prenatal Vit-Fe Fumarate-FA (MULTIVITAMIN-PRENATAL) 27-0.8 MG TABS tablet Take 1 tablet by mouth daily at 12 noon. Patient not taking: Reported on 01/30/2021    [provider]  sertraline (ZOLOFT) 25 MG tablet Take 1 tablet (25 mg total) by mouth daily. 01/30/21 03/26/21  Federico Flake, MD    Family History Family History  Problem Relation Age of Onset  . Hypertension Mother   . Asthma Sister   . Depression Maternal Aunt   . Bipolar disorder Maternal Aunt   . Hypertension Maternal Grandmother   . Hyperlipidemia Maternal Grandmother   . Diabetes Paternal Grandmother  Social History Social History   Tobacco Use  . Smoking status: Never Smoker  . Smokeless tobacco: Never Used  Vaping Use  . Vaping Use: Never used  Substance Use Topics  . Alcohol use: Yes    Comment: social gatherings  . Drug use: Not Currently    Types: Marijuana     Allergies   Pineapple and Augmentin [amoxicillin-pot clavulanate]   Review of Systems Review of Systems  Constitutional: Negative for activity change, appetite change and fever.  HENT: Positive for congestion, ear pain and nosebleeds.   Respiratory: Negative for shortness of breath.   Cardiovascular: Negative for chest pain.  Neurological: Negative for syncope.     Physical Exam Triage Vital Signs ED Triage Vitals [03/26/21 0810]  Enc Vitals Group     BP       Pulse      Resp      Temp      Temp src      SpO2      Weight      Height      Head Circumference      Peak Flow      Pain Score 0     Pain Loc      Pain Edu?      Excl. in GC?    No data found.  Updated Vital Signs BP 118/81 (BP Location: Left Arm)   Pulse 72   Temp 98.2 F (36.8 C) (Oral)   Resp 14   Ht 4\' 10"  (1.473 m)   Wt 142 lb (64.4 kg)   SpO2 100%   BMI 29.68 kg/m   Visual Acuity Right Eye Distance:   Left Eye Distance:   Bilateral Distance:    Right Eye Near:   Left Eye Near:    Bilateral Near:     Physical Exam Vitals and nursing note reviewed.  Constitutional:      General: She is not in acute distress.    Appearance: Normal appearance. She is not ill-appearing.  HENT:     Head: Normocephalic.     Right Ear: Tympanic membrane, ear canal and external ear normal. There is no impacted cerumen.     Left Ear: Tympanic membrane, ear canal and external ear normal. There is no impacted cerumen.     Nose: Congestion present.     Mouth/Throat:     Mouth: Mucous membranes are moist.     Pharynx: Oropharynx is clear. No posterior oropharyngeal erythema.  Cardiovascular:     Rate and Rhythm: Normal rate and regular rhythm.     Pulses: Normal pulses.     Heart sounds: Normal heart sounds. No murmur heard. No gallop.   Pulmonary:     Effort: Pulmonary effort is normal.     Breath sounds: Normal breath sounds. No wheezing, rhonchi or rales.  Skin:    General: Skin is warm and dry.     Capillary Refill: Capillary refill takes less than 2 seconds.     Coloration: Skin is not pale.  Neurological:     General: No focal deficit present.     Mental Status: She is alert and oriented to person, place, and time.  Psychiatric:        Mood and Affect: Mood normal.        Behavior: Behavior normal.        Thought Content: Thought content normal.        Judgment: Judgment normal.      UC Treatments /  Results  Labs (all labs ordered are listed, but only  abnormal results are displayed) Labs Reviewed - No data to display  EKG   Radiology No results found.  Procedures Procedures (including critical care time)  Medications Ordered in UC Medications - No data to display  Initial Impression / Assessment and Plan / UC Course  I have reviewed the triage vital signs and the nursing notes.  Pertinent labs & imaging results that were available during my care of the patient were reviewed by me and considered in my medical decision making (see chart for details).   Patient is a very pleasant and nontoxic 20 year old female here for evaluation as stated above in the HPI.  Physical exam reveals bilateral tympanic membranes that are pearly gray with a normal light reflex and clear external auditory canals.  Nasal mucosa is erythematous and edematous without rhinorrhea.  Oropharyngeal exam is benign.  Cardiopulmonary exam is benign.  While examining the left nostril patient again developed some venous bleeding.  The source of bleeding is posterior.  Patient directed to hold pressure and lean forward for 15 minutes.  Will reevaluate.    Patient is not having any active bleeding after holding direct pressure.  Visualization of the left nare reveals fresh venous oozing.  I administered 1 squirt of Neo-Synephrine nasal spray to the patient and will reevaluate.  Patient continues to be free of epistaxis.  We will discharge her home with Atrovent nasal spray to help with nasal congestion.  We will have her hold Flonase, NSAIDs, and aspirin.  I also cautioned her to not blow her nose forcefully and to paint the inside of her nostrils with Vaseline at bedtime to moisturize the tissues and make them less friable.  Return precautions reviewed.   Final Clinical Impressions(s) / UC Diagnoses   Final diagnoses:  Left-sided epistaxis     Discharge Instructions     Your nosebleed today may been caused by the inflammation of your nasal tissues from your nasal  congestion.  Each night at bedtime, apply a thin smear of Vaseline using a fresh Q-tip to the inside of each nostril.  This will help moisturize the tissues and make them less delicate.  Avoid using Advil, Aleve, or aspirin as this will increase her propensity to bleed.  Do not blow your nose forcefully.  For your nasal congestion use the Atrovent nasal spray, 2 squirts in each nostril every 6 hours as needed.  Return for reevaluation if your nosebleeds returns and you are unable to stop it with direct pressure.    ED Prescriptions    Medication Sig Dispense Auth. Provider   ipratropium (ATROVENT) 0.06 % nasal spray Place 2 sprays into both nostrils 4 (four) times daily. 15 mL Becky Augusta, NP     PDMP not reviewed this encounter.   Becky Augusta, NP 03/26/21 0919    Becky Augusta, NP 03/26/21 [phone]

## 2021-03-26 NOTE — Discharge Instructions (Addendum)
Your nosebleed today may been caused by the inflammation of your nasal tissues from your nasal congestion.  Each night at bedtime, apply a thin smear of Vaseline using a fresh Q-tip to the inside of each nostril.  This will help moisturize the tissues and make them less delicate.  Avoid using Advil, Aleve, or aspirin as this will increase her propensity to bleed.  Do not blow your nose forcefully.  For your nasal congestion use the Atrovent nasal spray, 2 squirts in each nostril every 6 hours as needed.  Return for reevaluation if your nosebleeds returns and you are unable to stop it with direct pressure.

## 2021-06-23 ENCOUNTER — Encounter: Payer: Self-pay | Admitting: Nurse Practitioner

## 2021-06-23 NOTE — Progress Notes (Signed)
Telephone call to patient regarding scheduling an appointment for a PAP.  No answer, left message to call and letter also mailed to residence.  Glenna Fellows, RN

## 2022-03-02 ENCOUNTER — Ambulatory Visit (LOCAL_COMMUNITY_HEALTH_CENTER): Payer: Medicaid Other | Admitting: Advanced Practice Midwife

## 2022-03-02 ENCOUNTER — Encounter: Payer: Self-pay | Admitting: Advanced Practice Midwife

## 2022-03-02 VITALS — BP 102/67 | Ht 59.0 in | Wt 136.0 lb

## 2022-03-02 DIAGNOSIS — Z3049 Encounter for surveillance of other contraceptives: Secondary | ICD-10-CM | POA: Diagnosis not present

## 2022-03-02 DIAGNOSIS — Z3009 Encounter for other general counseling and advice on contraception: Secondary | ICD-10-CM | POA: Diagnosis not present

## 2022-03-02 DIAGNOSIS — F32A Depression, unspecified: Secondary | ICD-10-CM | POA: Insufficient documentation

## 2022-03-02 DIAGNOSIS — Z113 Encounter for screening for infections with a predominantly sexual mode of transmission: Secondary | ICD-10-CM

## 2022-03-02 DIAGNOSIS — Z716 Tobacco abuse counseling: Secondary | ICD-10-CM

## 2022-03-02 LAB — HM HIV SCREENING LAB: HM HIV Screening: NEGATIVE

## 2022-03-02 NOTE — Progress Notes (Signed)
[facility] DEPARTMENT ?[facility] ?[address] [employer] ?Main Number: [phone] ? ? ? ?Family Planning Visit- Initial Visit ? ?Subjective:  ?Brenda Mcclure is a 21 y.o. DHF vaper [id number] (3 yo daughter)  being seen today for an initial annual visit and to discuss reproductive life planning.  The patient is currently using IUD or IUS for pregnancy prevention. Patient reports   does not want a pregnancy in the next year.   ? ? report they are looking for a method that provides High efficacy at preventing pregnancy ? ?Patient has the following medical conditions has Status post cesarean delivery and Mildly obese on their problem list. ? ?Chief Complaint  ?Patient presents with  ? Annual Exam  ?  PE, pap and STD check  ? ? ?Patient reports here for physical, pap. LMP 02/19/22. Paraguard inserted 10/2018. Last sex 01/01/22 without condom; with current partner x 1 year; not together now. Last physical 01/30/21. Last pap 01/30/21 insufficient cellularity. Last vaped 2 days ago. Last MJ yesterday. Last ETOH 02/24/22 (2 seltzers) 2x/mo.  Working 32 hours/wk and not in school. Living with her daughter.  +cry 2x/wk, increased sleep, poor appetite, +moody, irritable, low energy level, +anhedonia, -SI/HI ? ?Patient denies cigs, cigars ? ?Body mass index is 27.47 kg/m?. - Patient is eligible for diabetes screening based on BMI and age >67?  not applicable ?HA1C ordered? not applicable ? ?Patient reports 2  partner/s in last year. Desires STI screening?  Yes ? ?Has patient been screened once for HCV in the past?  No ? No results found for: HCVAB ? ?Does the patient have current drug use (including MJ), have a partner with drug use, and/or has been incarcerated since last result? Yes  ?If yes-- Screen for HCV through [facility] ?  ?Does the patient meet criteria for HBV testing? No ? ?Criteria:  ?-Household, sexual or needle sharing contact with HBV ?-History of drug use ?-HIV positive ?-Those with  known Hep C ? ? ?Health Maintenance Due  ?Topic Date Due  ? COVID-19 Vaccine (1) Never done  ? Hepatitis C Screening  Never done  ? CHLAMYDIA SCREENING  11/05/2019  ? PAP-Cervical Cytology Screening  Never done  ? ? ?Review of Systems  ?Constitutional:  Positive for weight loss (up and down).  ?Neurological:  Positive for headaches (daily always bil temples, -N&V,-audio, -vision, relieved with Advil).  ?All other systems reviewed and are negative. ? ?The following portions of the patient's history were reviewed and updated as appropriate: allergies, current medications, past family history, past medical history, past social history, past surgical history and problem list. Problem list updated. ? ? ?See flowsheet for other program required questions. ? ?Objective:  ? ?Vitals:  ? 03/02/22 1050  ?BP: 102/67  ?Weight: 136 lb (61.7 kg)  ?Height: 4\' 11"  (1.499 m)  ? ? ?Physical Exam ?Constitutional:   ?   Appearance: Normal appearance. She is normal weight.  ?HENT:  ?   Head: Normocephalic and atraumatic.  ?   Mouth/Throat:  ?   Mouth: Mucous membranes are moist.  ?   Comments: Last dental exam yesterday ?Eyes:  ?   Conjunctiva/sclera: Conjunctivae normal.  ?Cardiovascular:  ?   Rate and Rhythm: Normal rate and regular rhythm.  ?Pulmonary:  ?   Effort: Pulmonary effort is normal.  ?   Breath sounds: Normal breath sounds.  ?Chest:  ?Breasts: ?   Right: Normal.  ?   Left: Normal.  ?Abdominal:  ?  Palpations: Abdomen is soft.  ?   Comments: Soft without masses or tenderness, good tone  ?Genitourinary: ?   General: Normal vulva.  ?   Exam position: Lithotomy position.  ?   Vagina: Bleeding (red blood from menses. Paragurad strings visualized) present.  ?   Cervix: Normal.  ?   Uterus: Normal.   ?   Adnexa: Right adnexa normal and left adnexa normal.  ?   Rectum: Normal.  ?   Comments: Moderate blood from menses present but attempted pap ?Musculoskeletal:     ?   General: Normal range of motion.  ?   Cervical back: Normal  range of motion and neck supple.  ?Skin: ?   General: Skin is warm and dry.  ?Neurological:  ?   Mental Status: She is alert.  ?Psychiatric:     ?   Mood and Affect: Mood normal.  ? ? ? ? ?Assessment and Plan:  ?Brenda Mcclure is a 21 y.o. female presenting to the [facility] Department for an initial annual wellness/contraceptive visit ? ?Contraception counseling: Reviewed options based on patient desire and reproductive life plan. Patient is interested in IUD or IUS. This was provided to the patient today.  if not why not clearly documented ? ?Risks, benefits, and typical effectiveness rates were reviewed.  Questions were answered.  Written information was also given to the patient to review.   ? ?The patient will follow up in  1 years for surveillance.  The patient was told to call with any further questions, or with any concerns about this method of contraception.  Emphasized use of condoms 100% of the time for STI prevention. ? ?Need for ECP was assessed. Patient reported not meeting criteria as has Paraguard in place.  Reviewed options and patient desired No method of ECP, declined all   ? ?1. Family planning ?Pt desires contact info for Kathreen Cosier, LCSW ?- Chlamydia/Gonorr[facility] ?- Pap IG (Image Guided) ? ?2. Encounter for surveillance of other contraceptive ?Paraguard in situ ? ? ? ? ?No follow-ups on file. ? ?No future appointments. ? ?Alberteen Spindle, CNM ?

## 2022-03-02 NOTE — Progress Notes (Signed)
Pt here for PE, pap and STD check.   ?

## 2022-03-13 ENCOUNTER — Encounter: Payer: Self-pay | Admitting: Advanced Practice Midwife

## 2022-03-13 DIAGNOSIS — R87619 Unspecified abnormal cytological findings in specimens from cervix uteri: Secondary | ICD-10-CM | POA: Insufficient documentation

## 2022-03-13 LAB — PAP IG (IMAGE GUIDED): PAP Smear Comment: 0

## 2022-03-21 ENCOUNTER — Encounter: Payer: Self-pay | Admitting: Advanced Practice Midwife

## 2022-05-15 ENCOUNTER — Telehealth: Payer: Self-pay

## 2022-05-15 NOTE — Telephone Encounter (Signed)
Telephone call to patient regarding the need for a repeat PAP only due to an Unsatisfactory PAP in May.  Left message for patient to call [phone] to schedule an appointment.  PAP letter mailed today as well.  Hart Carwin, RN

## 2022-05-16 NOTE — Telephone Encounter (Signed)
Telephone call to patient regarding the need for a repeat PAP Smear in August due to an Unsatisfactory PAP in May.  Left message for patient to call [phone] to schedule an appointment.  Hart Carwin, RN

## 2022-05-24 NOTE — Telephone Encounter (Signed)
Telephone call to patient today to reschedule her PAP only appointment due to an Unsatisfactory PAP in May.  Patient will need to check her work schedule and call back to schedule the appointment.  Number to call for appointment provided.  Hart Carwin, RN

## 2022-06-11 NOTE — Telephone Encounter (Signed)
Close to PAP f/u due to patient not responding to messages left nor the PAP letter mailed.  PAP only needed due to Unsatisfactory Pap 03-02-2022.  Patient is aware due to phone conversation 05-24-2022.  She never called for an appointment as she reported she would do.  Hart Carwin, RN

## 2023-07-05 ENCOUNTER — Ambulatory Visit: Payer: Medicaid Other

## 2024-03-05 ENCOUNTER — Ambulatory Visit: Payer: Self-pay

## 2024-03-05 ENCOUNTER — Encounter: Payer: Self-pay | Admitting: Family Medicine

## 2024-03-05 ENCOUNTER — Ambulatory Visit (LOCAL_COMMUNITY_HEALTH_CENTER): Payer: Self-pay | Admitting: Family Medicine

## 2024-03-05 VITALS — BP 110/70 | HR 73 | Ht 59.0 in | Wt 160.6 lb

## 2024-03-05 DIAGNOSIS — Z3046 Encounter for surveillance of implantable subdermal contraceptive: Secondary | ICD-10-CM

## 2024-03-05 DIAGNOSIS — Z309 Encounter for contraceptive management, unspecified: Secondary | ICD-10-CM

## 2024-03-05 DIAGNOSIS — Z30011 Encounter for initial prescription of contraceptive pills: Secondary | ICD-10-CM

## 2024-03-05 DIAGNOSIS — Z113 Encounter for screening for infections with a predominantly sexual mode of transmission: Secondary | ICD-10-CM

## 2024-03-05 DIAGNOSIS — Z124 Encounter for screening for malignant neoplasm of cervix: Secondary | ICD-10-CM

## 2024-03-05 MED ORDER — NORGESTIMATE-ETH ESTRADIOL 0.25-35 MG-MCG PO TABS: 1.0000 | ORAL_TABLET | Freq: Every day | ORAL | Status: DC

## 2024-03-05 NOTE — Patient Instructions (Signed)
 Today we removed your IUD We gave you 6 months worth of birth control pills called "Mili", another name for ortho-cyclen.  Start the birth control pills the day after your next period (which should start any day now).

## 2024-03-05 NOTE — Progress Notes (Unsigned)
 Pt is here for PE and IUD removal. IUD removed successfully by Tempie Fee, MD. Patient tolerated well to removal process. The patient was dispensed Ortho-cyclen today. I provided counseling today regarding the medication. We discussed the medication, the side effects and when to call clinic. Condoms declined. Patient given the opportunity to ask questions for any clarification. Austine Lefort, RN.

## 2024-03-05 NOTE — Progress Notes (Unsigned)
 [facility] [address] [place] [address] Main phone: [phone]  Family Planning Visit - Initial Visit  Subjective:  Brenda Mcclure is a 23 y.o.  G1P1001   being seen today for an initial annual visit and to discuss reproductive life planning.  The patient is currently using IUD (or IUS) for pregnancy prevention. Patient desires a pregnancy  in the next year.   Patient reports they are looking for a method with the following characteristics:  Ready when they are Method they can control starting and stopping  Patient has the following medical conditions: Patient Active Problem List   Diagnosis Date Noted   pap unsatisfactory for evaluation x2 (01/30/21 and 03/02/22) 03/13/2022   Depression 2012 03/02/2022   Mildly obese 01/30/2021   Status post cesarean delivery 09/23/2018   Chief Complaint  Patient presents with   Annual Exam    Pt is here PE and IUD removal   Contraception   HPI Patient reports she would like a physical exam, removal of her IUD, and prescription of OCPs. She and her partner are planning to conceive late summer of this year. She would like to remove the IUD now and be on OCPs until she chooses to stop those.   Brenda Mcclure is amenable to PAP smear today and STI testing.   No history of HTN, DVT, PE, stroke, migraine with aura, congenital clotting disorders, or smoking.  Review of Systems  Constitutional:  Negative for fever, malaise/fatigue and weight loss.  Respiratory:  Negative for shortness of breath.   Cardiovascular:  Negative for chest pain and palpitations.   Diabetes screening This patient is 23 y.o. with a BMI of Body mass index is 32.44 kg/m.Aaron Aas  Is patient eligible for diabetes screening (age >35 and BMI >25)?  no  Was Hgb A1c ordered? not applicable  STI screening Patient reports 1 of partners in last year.  Does this patient desire STI screening?  Yes  Hepatitis  C screening Has patient been screened once for HCV in the past?  No  No results found for: "HCVAB"  Does the patient meet criteria for HCV testing? No   Hepatitis B screening Does the patient meet criteria for HBV testing? No  Cervical Cancer Screening  Result Date Procedure Results Follow-ups  03/02/2022 Pap IG (Image Guided) DIAGNOSIS:: Comment Specimen adequacy:: Comment Clinician Provided ICD10: Comment Performed by:: Comment QC reviewed by:: Comment PAP Smear Comment: . Note:: Comment Test Methodology: CANCELED   01/30/2021 IGP, rfx Aptima HPV ASCU DIAGNOSIS:: Comment Specimen adequacy:: Comment Clinician Provided ICD10: Comment Performed by:: Comment QC reviewed by:: Comment PAP Smear Comment: . Note:: Comment Test Methodology: CANCELED PAP Reflex: Comment    Health Maintenance Due  Topic Date Due   Hepatitis C Screening  Never done   CHLAMYDIA SCREENING  11/05/2019   Cervical Cancer Screening (Pap smear)  06/02/2022   COVID-19 Vaccine (1 - 2024-25 season) Never done   The following portions of the patient's history were reviewed and updated as appropriate: allergies, current medications, past family history, past medical history, past social history, past surgical history and problem list. Problem list updated.  See flowsheet for further details and programmatic requirements Hyperlink available at the top of the signed note in blue.  Flow sheet content below:  Pregnancy Intention Screening Does the patient want to become pregnant in the next year?: Yes Does the patient's partner want to become pregnant in the next year?: Yes Does  the patient currently take folic acid or women's MVI, or a prenatal viitamin?: No Would the patient like to discuss contraceptive options today?: Yes Results Follow up Password: [id number] Contraception History Past methods of contraception used by patient:: Intrauterine device or system (IDU,IUS) Adverse effects associated with Intrauterine  device or system (IUD,IUS): none Sexual History What age did you start your period?: 9 How often do you have your period?: monthly Date of last sex?: 03/03/24 Has the patient had unprotected sex within the last 5 days?: No Do you have sex with men, women, both men and women?: Men only In the past 2 months how many partners have you had sex with?: 1 In the past 12 months, how many partners have you had sex with?: 1 Is it possible that any of your sex partners in the past 12 months had sex with someone else whild they were still in a sexual relationship with you?: No What ways do you have sex?: Vaginal Do you or your partner use condoms and/or dental dams every time you have vaginal, oral or anal sex?: No Do you douche?: No Date of last HIV test?: 03/02/22 Have you ever had an STD?: Yes Have any of your partners had an STD?: Yes Partner Previous STD?: Chlamydia Date?:  (2018) Have you or your partner ever shot up drugs?: No Have any of your partners used drugs in the past?: No Have you or your partners exchanged money or drugs for sex?: No Counseling All Patients: Provide basic infertility counseling (l, Encourage mammagram for women 11 or older and younger than 50 if conditions support (R), Use specific methods of contraceoptive and identify adverse effects (R), Typical use rates for method effectiveness (R) Education: Make informed decision about family planning, Reduce risk of transmission and protection from STD's and HIV, Understand BMI >25 or >18.5 is a health risk (weight management educational materials to be provided to client requests) Contraception Wrap Up Current Method: IUD or IUS End Method: Oral Contraceptive Contraception Counseling Provided: Yes How was the end contraceptive method provided?: Provided on site  Objective:   Vitals:   03/05/24 1540  BP: 110/70  Pulse: 73  Weight: 160 lb 9.6 oz (72.8 kg)  Height: 4\' 11"  (1.499 m)   Physical Exam Exam conducted  with a chaperone present Brenda Rainwater, NP student).  Constitutional:      General: She is not in acute distress.    Appearance: She is normal weight. She is not ill-appearing, toxic-appearing or diaphoretic.  HENT:     Head: Normocephalic and atraumatic.     Nose: Nose normal.     Mouth/Throat:     Mouth: Mucous membranes are moist.     Pharynx: Oropharynx is clear. No oropharyngeal exudate or posterior oropharyngeal erythema.  Eyes:     General: No scleral icterus.       Right eye: No discharge.        Left eye: No discharge.     Conjunctiva/sclera: Conjunctivae normal.  Cardiovascular:     Rate and Rhythm: Normal rate and regular rhythm.     Heart sounds: Normal heart sounds. No murmur heard.    No gallop.  Pulmonary:     Effort: Pulmonary effort is normal.     Breath sounds: Normal breath sounds. No stridor. No wheezing, rhonchi or rales.  Abdominal:     General: Abdomen is flat. Bowel sounds are normal. There is no distension.     Palpations: Abdomen is soft.     Tenderness:  There is no abdominal tenderness. There is no guarding or rebound.  Genitourinary:    General: Normal vulva.     Exam position: Lithotomy position.     Pubic Area: No rash.      Tanner stage (genital): 5.     Labia:        Right: No rash, tenderness or lesion.        Left: No rash, tenderness or lesion.      Vagina: Normal. No signs of injury and foreign body. No vaginal discharge, erythema, tenderness or bleeding.     Cervix: Normal. No cervical motion tenderness, discharge, friability, erythema or eversion.     Uterus: Normal. Not enlarged, not tender and no uterine prolapse.      Adnexa: Right adnexa normal and left adnexa normal.     Comments: Narrow vaginal introitus with increased pelvic floor tone pH not collected as there are no vaginal complaints Musculoskeletal:        General: Normal range of motion.     Cervical back: Neck supple. No rigidity or tenderness.  Lymphadenopathy:     Head:      Right side of head: No submental, submandibular, tonsillar, preauricular or posterior auricular adenopathy.     Left side of head: No submental, submandibular, tonsillar, preauricular or posterior auricular adenopathy.     Cervical: No cervical adenopathy.     Right cervical: No superficial or posterior cervical adenopathy.    Left cervical: No superficial or posterior cervical adenopathy.     Upper Body:     Right upper body: No supraclavicular adenopathy.     Left upper body: No supraclavicular adenopathy.  Skin:    General: Skin is warm and dry.     Capillary Refill: Capillary refill takes less than 2 seconds.     Coloration: Skin is not jaundiced or pale.     Findings: No bruising, erythema, lesion or rash.  Neurological:     General: No focal deficit present.     Mental Status: She is alert and oriented to person, place, and time.  Psychiatric:        Mood and Affect: Mood normal.        Behavior: Behavior normal.    Procedure:  IUD Removal  Patient identified, informed consent performed, consent signed.  Patient was in the dorsal lithotomy position, normal external genitalia was noted.  A speculum was placed in the patient's vagina, normal discharge was noted, no lesions. The cervix was visualized, no lesions, no abnormal discharge.  The strings of the IUD were grasped and pulled using ring forceps. The IUD was removed in its entirety.  Patient tolerated the procedure well.    Patient plans for pregnancy soon. She was advised to avoid teratogens, take prenatal vitamin and folic acid. Routine preventative health maintenance measures emphasized.   Assessment and Plan:  Brenda Mcclure is a 23 y.o. female presenting to the [facility] Department for an initial annual wellness/contraceptive visit  Contraception counseling:  Reviewed options based on patient desire and reproductive life plan. Patient is interested in Oral Contraceptive. This was provided to the patient  today.   Risks, benefits, and typical effectiveness rates were reviewed.  Questions were answered.  Written information was also given to the patient to review.    The patient will follow up in  1 years for surveillance.  The patient was told to call with any further questions, or with any concerns about this method of contraception.  Emphasized use of condoms 100% of the time for STI prevention.  Emergency Contraception Precautions (ECP): Patient assessed for need of ECP. She is not a candidate based on desire for pregnancy and unexpired IUD in place .  Screening examination for venereal disease -     Chlamydia/Gonorrhea [facility] -     WET PREP FOR TRICH, YEAST, CLUE  Cervical cancer screening -     IGP, rfx Aptima HPV ASCU  Encounter for initial prescription of contraceptive pills -     Norgestimate-Eth Estradiol; Take 1 tablet by mouth daily.   No follow-ups on file.  No future appointments.  Jack Marts, MD

## 2024-03-06 LAB — WET PREP FOR TRICH, YEAST, CLUE
Clue Cell Exam: NEGATIVE
Trichomonas Exam: NEGATIVE
Yeast Exam: NEGATIVE

## 2024-03-12 LAB — IGP, RFX APTIMA HPV ASCU: PAP Smear Comment: 0

## 2024-03-18 ENCOUNTER — Ambulatory Visit: Payer: Self-pay | Admitting: Family Medicine

## 2024-05-07 ENCOUNTER — Ambulatory Visit: Payer: Self-pay

## 2024-05-07 VITALS — BP 109/64 | Ht 59.0 in | Wt 167.0 lb

## 2024-05-07 DIAGNOSIS — Z3009 Encounter for other general counseling and advice on contraception: Secondary | ICD-10-CM

## 2024-05-07 DIAGNOSIS — Z3201 Encounter for pregnancy test, result positive: Secondary | ICD-10-CM

## 2024-05-07 LAB — PREGNANCY, URINE: Preg Test, Ur: POSITIVE — AB

## 2024-05-07 MED ORDER — PRENATAL 27-0.8 MG PO TABS: 1.0000 | ORAL_TABLET | Freq: Every day | ORAL | Status: AC

## 2024-05-07 NOTE — Progress Notes (Signed)
 UPT positive. Positive pregnancy packet given and reviewed.   Plans prenatal care at [facility] and plans to contact them this week to schedule appt.   The patient was dispensed prenatal vitamins #100 today per SO Dr JAYSON Helling. I provided counseling today regarding the medication. We discussed the medication, the side effects and when to call clinic. Patient given the opportunity to ask questions. Questions answered.    Sent to clerk for presumptive eligibility/medicaid preg women. Taran Hable, RN

## 2024-05-13 ENCOUNTER — Encounter: Payer: Self-pay | Admitting: Advanced Practice Midwife

## 2024-05-13 ENCOUNTER — Emergency Department: Payer: Self-pay

## 2024-05-13 ENCOUNTER — Other Ambulatory Visit: Payer: Self-pay

## 2024-05-13 ENCOUNTER — Emergency Department
Admission: EM | Admit: 2024-05-13 | Discharge: 2024-05-13 | Disposition: A | Discharge status: Home / Self Care | Payer: Self-pay | Attending: Emergency Medicine | Admitting: Emergency Medicine

## 2024-05-13 DIAGNOSIS — R1030 Lower abdominal pain, unspecified: Secondary | ICD-10-CM

## 2024-05-13 DIAGNOSIS — O209 Hemorrhage in early pregnancy, unspecified: Secondary | ICD-10-CM | POA: Diagnosis present

## 2024-05-13 DIAGNOSIS — O418X10 Other specified disorders of amniotic fluid and membranes, first trimester, not applicable or unspecified: Secondary | ICD-10-CM | POA: Insufficient documentation

## 2024-05-13 DIAGNOSIS — Z3A01 Less than 8 weeks gestation of pregnancy: Secondary | ICD-10-CM | POA: Insufficient documentation

## 2024-05-13 DIAGNOSIS — O4690 Antepartum hemorrhage, unspecified, unspecified trimester: Secondary | ICD-10-CM

## 2024-05-13 LAB — COMPREHENSIVE METABOLIC PANEL WITH GFR
ALT: 24 U/L (ref 0–44)
AST: 22 U/L (ref 15–41)
Albumin: 3.5 g/dL (ref 3.5–5.0)
Alkaline Phosphatase: 67 U/L (ref 38–126)
Anion gap: 13 (ref 5–15)
BUN: 9 mg/dL (ref 6–20)
CO2: 24 mmol/L (ref 22–32)
Calcium: 9.2 mg/dL (ref 8.9–10.3)
Chloride: 100 mmol/L (ref 98–111)
Creatinine, Ser: 0.38 mg/dL — ABNORMAL LOW (ref 0.44–1.00)
GFR, Estimated: >60 mL/min (ref >60)
Glucose, Bld: 117 mg/dL — ABNORMAL HIGH (ref 70–99)
Potassium: 3 mmol/L — ABNORMAL LOW (ref 3.5–5.1)
Sodium: 137 mmol/L (ref 135–145)
Total Bilirubin: 0.5 mg/dL (ref 0.0–1.2)
Total Protein: 7.1 g/dL (ref 6.5–8.1)

## 2024-05-13 LAB — URINALYSIS, ROUTINE W REFLEX MICROSCOPIC
Bilirubin Urine: NEGATIVE
Glucose, UA: NEGATIVE mg/dL
Hgb urine dipstick: NEGATIVE
Ketones, ur: NEGATIVE mg/dL
Leukocytes,Ua: NEGATIVE
Nitrite: NEGATIVE
Protein, ur: NEGATIVE mg/dL
Specific Gravity, Urine: 1.009 (ref 1.005–1.030)
pH: 6 (ref 5.0–8.0)

## 2024-05-13 LAB — POC URINE PREG, ED: Preg Test, Ur: POSITIVE — AB

## 2024-05-13 LAB — CBC
HCT: 40.1 % (ref 36.0–46.0)
Hemoglobin: 13.7 g/dL (ref 12.0–15.0)
MCH: 30 pg (ref 26.0–34.0)
MCHC: 34.2 g/dL (ref 30.0–36.0)
MCV: 87.7 fL (ref 80.0–100.0)
Platelets: 174 K/uL (ref 150–400)
RBC: 4.57 MIL/uL (ref 3.87–5.11)
RDW: 13.5 % (ref 11.5–15.5)
WBC: 9.8 K/uL (ref 4.0–10.5)
nRBC: 0 % (ref 0.0–0.2)

## 2024-05-13 LAB — HCG, QUANTITATIVE, PREGNANCY: hCG, Beta Chain, Quant, S: 44440 m[IU]/mL — ABNORMAL HIGH (ref <5)

## 2024-05-13 LAB — TYPE AND SCREEN
ABO/RH(D): O POS
Antibody Screen: NEGATIVE

## 2024-05-13 NOTE — ED Provider Notes (Signed)
 [facility] Provider Note    Event Date/Time   First MD Initiated Contact with Patient 05/13/24 1243     (approximate)   History   Vaginal Bleeding   HPI  Eathel Pajak is a 23 y.o. female who comes in with her second pregnancy with concerns for vaginal bleeding.  Patient reports she has had 1 prior pregnancy without any issues.  She reports thinking she is about [redacted] weeks pregnant.  She reports having some blood less than a pad an hour that is now turned into just light spotting.  She does report a little bit of left lower quadrant pain.  She denies needing anything for pain.  Denies any new sexual partners.  She denies any vaginal discharge concerns for STDs  Physical Exam   Triage Vital Signs: ED Triage Vitals  Encounter Vitals Group     BP 05/13/24 1226 116/74     Girls Systolic BP Percentile --      Girls Diastolic BP Percentile --      Boys Systolic BP Percentile --      Boys Diastolic BP Percentile --      Pulse Rate 05/13/24 1226 87     Resp 05/13/24 1226 18     Temp 05/13/24 1226 98.9 F (37.2 C)     Temp src --      SpO2 05/13/24 1226 98 %     Weight 05/13/24 1227 165 lb (74.8 kg)     Height 05/13/24 1227 4' 11 (1.499 m)     Head Circumference --      Peak Flow --      Pain Score 05/13/24 1226 3     Pain Loc --      Pain Education --      Exclude from Growth Chart --     Most recent vital signs: Vitals:   05/13/24 1226  BP: 116/74  Pulse: 87  Resp: 18  Temp: 98.9 F (37.2 C)  SpO2: 98%     General: Awake, no distress.  CV:  Good peripheral perfusion.  Resp:  Normal effort.  Abd:  No distention.  Slight tenderness noted to the left lower quadrant but no rebound, no guarding. Other:     ED Results / Procedures / Treatments   Labs (all labs ordered are listed, but only abnormal results are displayed) Labs Reviewed  COMPREHENSIVE METABOLIC PANEL WITH GFR - Abnormal; Notable for the following components:      Result  Value   Potassium 3.0 (*)    Glucose, Bld 117 (*)    Creatinine, Ser 0.38 (*)    All other components within normal limits  POC URINE PREG, ED - Abnormal; Notable for the following components:   Preg Test, Ur POSITIVE (*)    All other components within normal limits  CBC  HCG, QUANTITATIVE, PREGNANCY  TYPE AND SCREEN      RADIOLOGY I have reviewed the us  personally and interpreted + IUP    PROCEDURES:  Critical Care performed: No  Procedures   MEDICATIONS ORDERED IN ED: Medications - No data to display   IMPRESSION / MDM / ASSESSMENT AND PLAN / ED COURSE  I reviewed the triage vital signs and the nursing notes.   Patient's presentation is most consistent with acute presentation with potential threat to life or bodily function.   Patient comes in with concerns for vaginal bleeding in setting of pregnancy.  Will get ultrasound evaluate for ectopic pregnancy given  pregnancy test was positive.  She is O+ does not need RhoGAM.  Her hemoglobin is stable and she is hemodynamically stable.  CMP shows slightly low potassium which is a chronic issue.  Patient's ultrasound is reassuring.  I did update patient we discussed what a subchorionic hemorrhage was.  We discussed that this can be considered a threatened miscarriage and we do not know for sure if the miscarriage will happen  Patient handed off pending UA and discharge home to ensure no UTI  FINAL CLINICAL IMPRESSION(S) / ED DIAGNOSES   Final diagnoses:  Vaginal bleeding in pregnancy  Subchorionic hematoma in first trimester, single or unspecified fetus     Rx / DC Orders   ED Discharge Orders     None        Note:  This document was prepared using Dragon voice recognition software and may include unintentional dictation errors.   Ernest Ronal BRAVO, MD 05/13/24 [phone]

## 2024-05-13 NOTE — ED Triage Notes (Signed)
 Pt comes in via pov with complaints of mild cramping and now bright red bleeding. Pt states that she is [redacted] weeks pregnant. Pt states that she has had to change her pad twice since last night, and complains of pain 3/10 at this time. Pt states that the discharge went from light pink, to brown, to bright red this morning.

## 2024-05-13 NOTE — Discharge Instructions (Addendum)
 Return to the ER if you develop worsening symptoms including bleeding more than a pad an hour or any other concerns.  You should follow-up with your OB/GYN.  IMPRESSION: Single live intrauterine gestation of 6 weeks 1 day. Small subchorionic hemorrhage.

## 2024-05-14 ENCOUNTER — Encounter: Payer: Self-pay | Admitting: Family Medicine

## 2024-05-14 ENCOUNTER — Other Ambulatory Visit: Payer: Self-pay | Admitting: Family Medicine

## 2024-05-14 DIAGNOSIS — O209 Hemorrhage in early pregnancy, unspecified: Secondary | ICD-10-CM

## 2024-05-14 DIAGNOSIS — Z3A01 Less than 8 weeks gestation of pregnancy: Secondary | ICD-10-CM

## 2024-05-14 DIAGNOSIS — Z3481 Encounter for supervision of other normal pregnancy, first trimester: Secondary | ICD-10-CM | POA: Insufficient documentation

## 2024-05-14 HISTORY — DX: Hemorrhage in early pregnancy, unspecified: O20.9

## 2024-05-14 NOTE — Progress Notes (Signed)
 Reviewed ED visit note from 7/23. Single live IUP estimated [redacted]w[redacted]d seen with small subchorionic hemorrhage. B-HCG reassuring.   Given uncertain LMP (if [redacted]w[redacted]d on 7/23, then LMP expected 03/25/24) and 6 day discrepancy between approx LMP and US , will use early US  for dating. Chart updated.   Dorothyann Helling, MD 05/14/24  12:41 PM

## 2024-05-25 ENCOUNTER — Encounter: Payer: Self-pay | Admitting: Family Medicine

## 2024-06-12 ENCOUNTER — Encounter: Payer: Self-pay | Admitting: Nurse Practitioner

## 2024-06-12 ENCOUNTER — Other Ambulatory Visit: Payer: Self-pay | Admitting: Nurse Practitioner

## 2024-06-12 ENCOUNTER — Ambulatory Visit: Payer: Self-pay | Admitting: Nurse Practitioner

## 2024-06-12 VITALS — BP 115/70 | HR 100 | Temp 97.3°F | Wt 167.8 lb

## 2024-06-12 DIAGNOSIS — N83209 Unspecified ovarian cyst, unspecified side: Secondary | ICD-10-CM | POA: Insufficient documentation

## 2024-06-12 DIAGNOSIS — F32A Depression, unspecified: Secondary | ICD-10-CM

## 2024-06-12 DIAGNOSIS — Z3481 Encounter for supervision of other normal pregnancy, first trimester: Secondary | ICD-10-CM

## 2024-06-12 DIAGNOSIS — Z8759 Personal history of other complications of pregnancy, childbirth and the puerperium: Secondary | ICD-10-CM

## 2024-06-12 DIAGNOSIS — O99210 Obesity complicating pregnancy, unspecified trimester: Secondary | ICD-10-CM | POA: Insufficient documentation

## 2024-06-12 DIAGNOSIS — O99211 Obesity complicating pregnancy, first trimester: Secondary | ICD-10-CM

## 2024-06-12 DIAGNOSIS — J45909 Unspecified asthma, uncomplicated: Secondary | ICD-10-CM

## 2024-06-12 DIAGNOSIS — Z3480 Encounter for supervision of other normal pregnancy, unspecified trimester: Secondary | ICD-10-CM | POA: Insufficient documentation

## 2024-06-12 DIAGNOSIS — O209 Hemorrhage in early pregnancy, unspecified: Secondary | ICD-10-CM

## 2024-06-12 DIAGNOSIS — Z98891 History of uterine scar from previous surgery: Secondary | ICD-10-CM

## 2024-06-12 LAB — HEMOGLOBIN, FINGERSTICK: Hemoglobin: 13.4 g/dL (ref 11.1–15.9)

## 2024-06-12 MED ORDER — ALBUTEROL SULFATE HFA 108 (90 BASE) MCG/ACT IN AERS
1.0000 | INHALATION_SPRAY | Freq: Four times a day (QID) | RESPIRATORY_TRACT | 0 refills | Status: AC | PRN
Start: 2024-06-12 — End: 2025-06-12

## 2024-06-12 MED ORDER — ASPIRIN 81 MG PO TBEC
81.0000 mg | DELAYED_RELEASE_TABLET | Freq: Every day | ORAL | Status: AC
Start: 2024-06-12 — End: ?

## 2024-06-12 MED ORDER — IPRATROPIUM BROMIDE 0.06 % NA SOLN
2.0000 | Freq: Four times a day (QID) | NASAL | 12 refills | Status: AC
Start: 2024-06-12 — End: ?

## 2024-06-12 NOTE — Progress Notes (Addendum)
 Presents for initiation of prenatal care. 05/13/24 [facility] ED evaluation for abd pain and vaginal bleeding. US  completed.  [place] the [place]  with international travel to [place], [place] and [place] / [place]. Quantiferon TB Gold today. Desires carrier screening and NIPS today. Peak flows today = 330, 320, 450 with good effort. FREDRIK Bolognese NP aware. Client given peak flow meter and instruction pamphlet. Counseled if having issues with asthma and needs to be seen in clinic to bring peak flow meter with her. Understanding verbalized. Burnadette Lowers, RN Hgb = 13.4 and no intervention required per standing order. Unable to participate in Centering at this time due to work schedule. 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 Partner is currently unemployed. Burnadette Lowers, RN Referral faxed to [facility] with snapshot pages and provider initial encounter. Fax confirmation received. Burnadette Lowers, RN
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
## 2024-06-13 LAB — PROTEIN / CREATININE RATIO, URINE
Creatinine, Urine: 163.1 mg/dL
Protein, Ur: 12.4 mg/dL
Protein/Creat Ratio: 76 mg/g{creat} (ref 0–200)

## 2024-06-15 ENCOUNTER — Ambulatory Visit: Payer: Self-pay

## 2024-06-15 ENCOUNTER — Ambulatory Visit: Payer: Self-pay | Admitting: Family Medicine

## 2024-06-15 DIAGNOSIS — Z3480 Encounter for supervision of other normal pregnancy, unspecified trimester: Secondary | ICD-10-CM

## 2024-06-15 DIAGNOSIS — N158 Other specified renal tubulo-interstitial diseases: Secondary | ICD-10-CM

## 2024-06-15 DIAGNOSIS — E71310 Long chain/very long chain acyl CoA dehydrogenase deficiency: Secondary | ICD-10-CM

## 2024-06-15 DIAGNOSIS — O285 Abnormal chromosomal and genetic finding on antenatal screening of mother: Secondary | ICD-10-CM

## 2024-06-15 NOTE — Progress Notes (Signed)
 Baseline urine P:C normal.   Dorothyann Helling, MD 06/15/24  5:47 PM

## 2024-06-16 NOTE — Progress Notes (Signed)
 Urine P:C normal.   Dorothyann Helling, MD 06/16/24  11:33 AM

## 2024-06-23 LAB — PREGNANCY, INITIAL SCREEN
Antibody Screen: NEGATIVE
Basophils Absolute: 0.1 x10E3/uL (ref 0.0–0.2)
Basos: 1 %
Bilirubin, UA: NEGATIVE
Chlamydia trachomatis, NAA: NEGATIVE
EOS (ABSOLUTE): 0.3 x10E3/uL (ref 0.0–0.4)
Eos: 3 %
Glucose, UA: NEGATIVE
HCV Ab: NONREACTIVE
HIV Screen 4th Generation wRfx: NONREACTIVE
Hematocrit: 41 % (ref 34.0–46.6)
Hemoglobin: 13.6 g/dL (ref 11.1–15.9)
Hepatitis B Surface Ag: NEGATIVE
Immature Grans (Abs): 0 x10E3/uL (ref 0.0–0.1)
Immature Granulocytes: 0 %
Ketones, UA: NEGATIVE
Leukocytes,UA: NEGATIVE
Lymphocytes Absolute: 2.2 x10E3/uL (ref 0.7–3.1)
Lymphs: 21 %
MCH: 31.2 pg (ref 26.6–33.0)
MCHC: 33.2 g/dL (ref 31.5–35.7)
MCV: 94 fL (ref 79–97)
Monocytes Absolute: 0.6 x10E3/uL (ref 0.1–0.9)
Monocytes: 6 %
Neisseria Gonorrhoeae by PCR: NEGATIVE
Neutrophils Absolute: 7.1 x10E3/uL — ABNORMAL HIGH (ref 1.4–7.0)
Neutrophils: 69 %
Nitrite, UA: NEGATIVE
Platelets: 173 x10E3/uL (ref 150–450)
Protein,UA: NEGATIVE
RBC, UA: NEGATIVE
RBC: 4.36 x10E6/uL (ref 3.77–5.28)
RDW: 13.3 % (ref 11.7–15.4)
RPR Ser Ql: NONREACTIVE
Rh Factor: POSITIVE
Rubella Antibodies, IGG: 2.77 {index} (ref >0.99)
Specific Gravity, UA: 1.028 (ref 1.005–1.030)
Urobilinogen, Ur: 0.2 mg/dL (ref 0.2–1.0)
WBC: 10.4 x10E3/uL (ref 3.4–10.8)
pH, UA: 6.5 (ref 5.0–7.5)

## 2024-06-23 LAB — COMPREHENSIVE METABOLIC PANEL WITH GFR
ALT: 15 IU/L (ref 0–32)
AST: 13 IU/L (ref 0–40)
Albumin: 3.9 g/dL — ABNORMAL LOW (ref 4.0–5.0)
Alkaline Phosphatase: 77 IU/L (ref 44–121)
BUN/Creatinine Ratio: 18 (ref 9–23)
BUN: 7 mg/dL (ref 6–20)
Bilirubin Total: <0.2 mg/dL (ref 0.0–1.2)
CO2: 24 mmol/L (ref 20–29)
Calcium: 9.6 mg/dL (ref 8.7–10.2)
Chloride: 100 mmol/L (ref 96–106)
Creatinine, Ser: 0.4 mg/dL — ABNORMAL LOW (ref 0.57–1.00)
Globulin, Total: 2.8 g/dL (ref 1.5–4.5)
Glucose: 99 mg/dL (ref 70–99)
Potassium: 3.8 mmol/L (ref 3.5–5.2)
Sodium: 138 mmol/L (ref 134–144)
Total Protein: 6.7 g/dL (ref 6.0–8.5)
eGFR: 141 mL/min/1.73 (ref >59)

## 2024-06-23 LAB — MATERNIT21  PLUS CORE+ESS+SCA, BLOOD
11q23 deletion (Jacobsen): NOT DETECTED
15q11 deletion (PW Angelman): NOT DETECTED
1p36 deletion syndrome: NOT DETECTED
22q11 deletion (DiGeorge): NOT DETECTED
4p16 deletion(Wolf-Hirschhorn): NOT DETECTED
5p15 deletion (Cri-du-chat): NOT DETECTED
8q24 deletion (Langer-Giedion): NOT DETECTED
Fetal Fraction: 17
Monosomy X (Turner Syndrome): NOT DETECTED
Result (T21): NEGATIVE
Trisomy 13 (Patau syndrome): NEGATIVE
Trisomy 16: NOT DETECTED
Trisomy 18 (Edwards syndrome): NEGATIVE
Trisomy 21 (Down syndrome): NEGATIVE
Trisomy 22: NOT DETECTED
XXX (Triple X Syndrome): NOT DETECTED
XXY (Klinefelter Syndrome): NOT DETECTED
XYY (Jacobs Syndrome): NOT DETECTED

## 2024-06-23 LAB — QUANTIFERON-TB GOLD PLUS
QuantiFERON Mitogen Value: >10 [IU]/mL
QuantiFERON Nil Value: 0.06 [IU]/mL
QuantiFERON TB1 Ag Value: 0.07 [IU]/mL
QuantiFERON TB2 Ag Value: 0.05 [IU]/mL
QuantiFERON-TB Gold Plus: NEGATIVE

## 2024-06-23 LAB — GLUCOSE, 1 HOUR GESTATIONAL: Gestational Diabetes Screen: 105 mg/dL (ref 70–139)

## 2024-06-23 LAB — MICROSCOPIC EXAMINATION
Bacteria, UA: NONE SEEN
Casts: NONE SEEN /LPF
RBC, Urine: NONE SEEN /HPF (ref 0–2)
WBC, UA: NONE SEEN /HPF (ref 0–5)

## 2024-06-23 LAB — LEAD, BLOOD (ADULT >= 16 YRS): Lead-Whole Blood: <1 ug/dL (ref 0.0–3.4)

## 2024-06-23 LAB — HCV INTERPRETATION

## 2024-06-23 LAB — BEACON CARRIER EXPD GENE 427

## 2024-06-23 LAB — TSH: TSH: 2.26 u[IU]/mL (ref 0.450–4.500)

## 2024-06-23 LAB — HGB A1C W/O EAG: Hgb A1c MFr Bld: 5.2 % (ref 4.8–5.6)

## 2024-06-23 LAB — URINE CULTURE, OB REFLEX

## 2024-06-24 DIAGNOSIS — O285 Abnormal chromosomal and genetic finding on antenatal screening of mother: Secondary | ICD-10-CM | POA: Insufficient documentation

## 2024-06-24 DIAGNOSIS — E71310 Long chain/very long chain acyl CoA dehydrogenase deficiency: Secondary | ICD-10-CM | POA: Insufficient documentation

## 2024-06-24 DIAGNOSIS — N158 Other specified renal tubulo-interstitial diseases: Secondary | ICD-10-CM | POA: Insufficient documentation

## 2024-06-24 NOTE — Progress Notes (Signed)
 Reviewed prenatal labs. Negative or clinically insignificant: prenatal profile, CMP, 1 hr GTT, A1c, TSH, UA, urine cx, cfDNA, lead, QFT.   (+) carrier screen, placed referral to genetic counseling at [facility] online through [facility]. 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   Dorothyann Helling, MD 06/24/24  2:00 PM

## 2024-06-29 ENCOUNTER — Telehealth: Payer: Self-pay

## 2024-06-29 NOTE — Telephone Encounter (Signed)
 Per [employer], 2 attempts to contact client via phone call have been made for a genetic counseling appt. Call to client and counseled regarding above. Per client, she always answers her calls and has not received any calls from [facility]. Client provided scheduling number and she states will call for an appt. Burnadette Lowers, RN

## 2024-06-30 NOTE — Addendum Note (Signed)
 Addended by: Emmaclaire Switala on: 06/30/2024 02:02 PM   Modules accepted: Orders

## 2024-07-09 NOTE — Progress Notes (Unsigned)
 Per [facility], client has PFTs appt 08/28/24 at 0900 and appt with [facility] 09/09/24 at 1000. Both appts are at [facility]. Instructions for PFT printed from [facility] and pink sticky noted to give to client at [facility] 07/10/24. Burnadette Lowers, RN

## 2024-07-10 ENCOUNTER — Telehealth: Payer: Self-pay | Admitting: Family Medicine

## 2024-07-10 ENCOUNTER — Encounter: Payer: Self-pay | Admitting: Physician Assistant

## 2024-07-10 ENCOUNTER — Ambulatory Visit: Payer: Self-pay | Admitting: Physician Assistant

## 2024-07-10 ENCOUNTER — Other Ambulatory Visit: Payer: Self-pay

## 2024-07-10 ENCOUNTER — Emergency Department

## 2024-07-10 ENCOUNTER — Emergency Department
Admission: EM | Admit: 2024-07-10 | Discharge: 2024-07-10 | Disposition: A | Discharge status: Home / Self Care | Source: Ambulatory Visit

## 2024-07-10 ENCOUNTER — Encounter: Payer: Self-pay | Admitting: Family Medicine

## 2024-07-10 VITALS — BP 116/66 | HR 86 | Temp 97.1°F | Wt 168.2 lb

## 2024-07-10 DIAGNOSIS — O99210 Obesity complicating pregnancy, unspecified trimester: Secondary | ICD-10-CM

## 2024-07-10 DIAGNOSIS — Z23 Encounter for immunization: Secondary | ICD-10-CM

## 2024-07-10 DIAGNOSIS — Z3480 Encounter for supervision of other normal pregnancy, unspecified trimester: Secondary | ICD-10-CM

## 2024-07-10 DIAGNOSIS — O99212 Obesity complicating pregnancy, second trimester: Secondary | ICD-10-CM

## 2024-07-10 DIAGNOSIS — Z3A14 14 weeks gestation of pregnancy: Secondary | ICD-10-CM

## 2024-07-10 DIAGNOSIS — O368321 Maternal care for abnormalities of the fetal heart rate or rhythm, second trimester, fetus 1: Secondary | ICD-10-CM | POA: Diagnosis present

## 2024-07-10 DIAGNOSIS — O285 Abnormal chromosomal and genetic finding on antenatal screening of mother: Secondary | ICD-10-CM

## 2024-07-10 DIAGNOSIS — O368390 Maternal care for abnormalities of the fetal heart rate or rhythm, unspecified trimester, not applicable or unspecified: Secondary | ICD-10-CM

## 2024-07-10 DIAGNOSIS — Z3482 Encounter for supervision of other normal pregnancy, second trimester: Secondary | ICD-10-CM

## 2024-07-10 DIAGNOSIS — J45909 Unspecified asthma, uncomplicated: Secondary | ICD-10-CM

## 2024-07-10 DIAGNOSIS — F32A Depression, unspecified: Secondary | ICD-10-CM

## 2024-07-10 DIAGNOSIS — L309 Dermatitis, unspecified: Secondary | ICD-10-CM

## 2024-07-10 DIAGNOSIS — O209 Hemorrhage in early pregnancy, unspecified: Secondary | ICD-10-CM

## 2024-07-10 LAB — URINALYSIS, ROUTINE W REFLEX MICROSCOPIC
Bilirubin Urine: NEGATIVE
Glucose, UA: NEGATIVE mg/dL
Hgb urine dipstick: NEGATIVE
Ketones, ur: NEGATIVE mg/dL
Leukocytes,Ua: NEGATIVE
Nitrite: NEGATIVE
Protein, ur: NEGATIVE mg/dL
Specific Gravity, Urine: 1.011 (ref 1.005–1.030)
pH: 7 (ref 5.0–8.0)

## 2024-07-10 LAB — HCG, QUANTITATIVE, PREGNANCY: hCG, Beta Chain, Quant, S: 29251 m[IU]/mL — ABNORMAL HIGH (ref <5)

## 2024-07-10 LAB — POC URINE PREG, ED: Preg Test, Ur: POSITIVE — NL

## 2024-07-10 MED ORDER — CLOBETASOL PROPIONATE 0.05 % EX CREA: 1.0000 | TOPICAL_CREAM | Freq: Two times a day (BID) | CUTANEOUS | 0 refills | Status: AC

## 2024-07-10 MED ORDER — ONDANSETRON 4 MG PO TBDP
4.0000 mg | ORAL_TABLET | Freq: Once | ORAL | Status: AC
  Administered 2024-07-10: 4 mg via ORAL
  Filled 2024-07-10: qty 1

## 2024-07-10 MED ORDER — ONDANSETRON 4 MG PO TBDP: 4.0000 mg | ORAL_TABLET | Freq: Three times a day (TID) | ORAL | 0 refills | Status: AC | PRN

## 2024-07-10 NOTE — ED Notes (Signed)
 Patient declined discharge vital signs.

## 2024-07-10 NOTE — Telephone Encounter (Signed)
 Call to client as US  ordered this am for viability scheduled for 2 pm as a work-in at the [facility]. Per client, she is currently in the ED for evaluation due to no heartbeat heard this am. Client counseled that 2 pm US  appt today would be counseled. Also counseled that since in ED, no referral would be sent to [facility] for evaluation of no heartbeat. Client agreeable with plan. CHARLENA Satchel, NP notified that RN would cancel US  order. Burnadette Lowers, RN

## 2024-07-10 NOTE — Progress Notes (Addendum)
 Verified has [facility] contact card. Aware of 08/28/24 [facility] PFT appt at [facility] and test prep instructions printed from [facility] and given to client. Also aware of 09/09/24 [facility] appt. Aware of 07/31/24 genetic counseling appt at [facility]. Counseled on recommendation for flu vaccine in pregnancy and for those 35 months of age and older. Tolerated injection today without complaint. Per client, forgot to begin Aspirin  81 mg every day at 12 weeks of pregnancy. Reports has Aspirin  at home and will begin today. Burnadette Lowers, RN

## 2024-07-10 NOTE — ED Triage Notes (Addendum)
 Pt comes with c/o abnormal US . Pt was at her OBGYN and is 14 weeks. Pt sent here bc staff couldnt hear heart beat with doppler.   Pt denies any pain or bleeding.   Pt here for US 

## 2024-07-10 NOTE — Discharge Instructions (Addendum)
 Your ultrasound revealed a single live intrauterine pregnancy estimated age 25 weeks and 4 days with a heart rate of 149 bpm. The ultrasound noted your baby is in the breech position.  Please follow-up with your OB/GYN for further management.

## 2024-07-10 NOTE — Progress Notes (Signed)
 [facility] [address], [address] [place] [address] Main phone: [phone]  Prenatal Visit  Subjective:  Brenda Mcclure is a 23 y.o. G2P1001 at [redacted]w[redacted]d being seen today for ongoing prenatal care.  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  Contractions: Not present. Vag. Bleeding: None.  Movement: Absent. Denies leaking of fluid/ROM.   The following portions of the patient's history were reviewed and updated as appropriate: allergies, current medications, past family history, past medical history, past social history, past surgical history and problem list. Problem list updated.  Objective:   Vitals:   07/10/24 0951  BP: 116/66  Pulse: 86  Temp: (!) 97.1 F (36.2 C)  Weight: 168 lb 3.2 oz (76.3 kg)   Fetal Status: Fetal Heart Rate (bpm):  (not auscultated, US  ordered)   Movement: Absent     General:  Alert, oriented and cooperative. Patient is in no acute distress.  Skin: Skin is warm and dry. No rash noted.   Cardiovascular: Normal heart rate noted  Respiratory: Normal respiratory effort, no problems with respiration noted  Abdomen: Soft, gravid, appropriate for gestational age.  Pain/Pressure: Absent     Pelvic: Not indicated        Extremities: Normal range of  motion.  Edema: None  Mental Status: Normal mood and affect. Normal behavior. Normal judgment and thought content.   Assessment and Plan:  Pregnancy: G2P1001 at [redacted]w[redacted]d  1. [redacted] weeks gestation of pregnancy (Primary) - Nausea is manageable, drinking peppermint tea and ginger ale as needed, eating small frequent meals - Concern for a rash on top of her feet x2 weeks, had similar rash that she noticed September 1st on her arms, belly and breasts that went away within about a week of presenting. Took some OTC allergy medicine which helped. No new soaps, detergents, etc. No one around her has a similar rash.   2. Supervision of other normal pregnancy, antepartum - History of c-section and desires repeat, delivery at [facility] - consult closer to 28 weeks - No fetal heart tones auscultated today. Myself, Leita Bolognese and AnnaMarie Streilein attempted.  - Viability US  ordered - nursing will call to schedule  3. Asthma, unspecified asthma severity, unspecified whether complicated, unspecified whether persistent - Connected with [facility] Pulmonology and has appt scheduled for PFTs 08/28/24 and visit on 09/09/24 - Albuterol  as needed, ipratropium 4 times daily. - Asthma was worse when sick this past week. Did not need her albuterol  more often though. Last time she used albuterol  was Monday night.   4. Obesity in pregnancy - Normal prenatal obesity labs including CMP, 1 hour GTT, TSH, A1c, P/C ratio - Aspirin  not yet initiated, advised to start today  5. Abnormal genetic test in pregnancy - Referral for genetic counseling - appt scheduled  6. Bleeding in early pregnancy - Resolved, no  recent bleeding - US  on 05/13/24 showed viable IUP with small subchorionic hemorrhage  7. Depression - No current meds and declined counseling referral - EPDS 6 at new OB - Mood reported as very good, no concerns  8. Dermatitis - Area of breasts, abdomen and arms has resolved, tops of feet very itchy and bothering her.  She has a history of eczema. Tops of her feet with small, rough bumps and mild erythema. Patient has difficulty wearing shoes due to the itching. - clobetasol  cream (TEMOVATE ) 0.05 %; Apply 1 Application topically 2 (two) times daily.  Dispense: 30 g; Refill: 0   Preterm labor symptoms and general obstetric precautions including but not limited to vaginal bleeding, contractions, leaking of fluid and fetal movement were reviewed in detail with the patient. Please refer to After Visit Summary for other counseling recommendations.   Return in about 4 weeks (around 08/07/2024).  Future Appointments  Date Time Provider Department Center  08/07/2024 10:00 AM [facility] PROVIDER [facility] None   Damien FORBES Satchel, NP

## 2024-07-10 NOTE — Addendum Note (Signed)
 Addended by: BUTLER BURNADETTE POUR on: 07/10/2024 12:48 PM   Modules accepted: Orders

## 2024-07-10 NOTE — ED Provider Notes (Signed)
 [facility] Emergency Department Provider Note     Event Date/Time   First MD Initiated Contact with Patient 07/10/24 1320     (approximate)   History   abnormal US    HPI  Brenda Mcclure is a 23 y.o. female G2P1001 at [redacted]w[redacted]d advised by her OB/GYN to seek ED evaluation for ultrasound to assess fetal heart tones.  Patient reports she was seen at her OB/GYN office earlier today for her regular prenatal care and they were unable to detect fetal heartbeat with Doppler.  She was advised to seek ED evaluation.  She is currently asymptomatic.  Denies vaginal bleeding.  Endorses mild nausea.  Endorses feeling fetal movement intermittently.  No other complaint.     Physical Exam   Triage Vital Signs: ED Triage Vitals  Encounter Vitals Group     BP 07/10/24 1248 116/63     Girls Systolic BP Percentile --      Girls Diastolic BP Percentile --      Boys Systolic BP Percentile --      Boys Diastolic BP Percentile --      Pulse Rate 07/10/24 1248 88     Resp 07/10/24 1248 18     Temp 07/10/24 1248 98.2 F (36.8 C)     Temp src --      SpO2 07/10/24 1248 94 %     Weight 07/10/24 1339 167 lb 15.9 oz (76.2 kg)     Height 07/10/24 1339 4' 11 (1.499 m)     Head Circumference --      Peak Flow --      Pain Score 07/10/24 1246 0     Pain Loc --      Pain Education --      Exclude from Growth Chart --     Most recent vital signs: Vitals:   07/10/24 1248  BP: 116/63  Pulse: 88  Resp: 18  Temp: 98.2 F (36.8 C)  SpO2: 94%   General Awake, no distress.  HEENT NCAT.  CV:  Good peripheral perfusion.  RESP:  Normal effort.  ABD:  No distention.   ED Results / Procedures / Treatments   Labs (all labs ordered are listed, but only abnormal results are displayed) Labs Reviewed  HCG, QUANTITATIVE, PREGNANCY - Abnormal; Notable for the following components:      Result Value   hCG, Beta Chain, Quant, S 29,251 (*)    All other components within normal  limits  URINALYSIS, ROUTINE W REFLEX MICROSCOPIC - Abnormal; Notable for the following components:   Color, Urine YELLOW (*)    APPearance CLEAR (*)    All other components within normal limits  POC URINE PREG, ED - Abnormal   RADIOLOGY  I personally viewed and evaluated these images as part of my medical decision making, as well as reviewing the written report by the radiologist.  ED Provider Interpretation: Single live intrauterine pregnancy  US  OB Limited Result Date: 07/10/2024 CLINICAL DATA:  No fetal heart tones detected EXAM: LIMITED OBSTETRIC ULTRASOUND FINDINGS: Number of Fetuses: 1 Heart Rate:  149 bpm Movement: Yes Presentation: Breech Previa: No Placental Location: Anterior Amniotic Fluid (Subjective): Normal BPD:  2.6cm 14w 4d Maternal Findings: Cervix:  Cervix is closed measuring 2.8 cm. Uterus/Adnexae: No pelvic free fluid. Left ovary measures 3.0 x 1.6 x 1.9 cm. The right ovary is not well seen. No adnexal masses. IMPRESSION: 1. Single live intrauterine pregnancy as above, estimated age 64 weeks and 4  days. Electronically Signed   By: Ozell Daring M.D.   On: 07/10/2024 15:34    PROCEDURES:  Critical Care performed: No  Procedures   MEDICATIONS ORDERED IN ED: Medications  ondansetron  (ZOFRAN -ODT) disintegrating tablet 4 mg (4 mg Oral Given 07/10/24 1416)     IMPRESSION / MDM / ASSESSMENT AND PLAN / ED COURSE  I reviewed the triage vital signs and the nursing notes.                              Clinical Course as of 07/10/24 1602  Fri Jul 10, 2024  1601 Urinalysis, Routine w reflex microscopic -Urine, Clean Catch(!) No infectious etiology [MH]  1601 hCG, quantitative, pregnancy(!) 219, 251 [MH]  1601 US  OB Limited IMPRESSION: 1. Single live intrauterine pregnancy as above, estimated age 75 weeks and 4 days.  Presentation breech [MH]    Clinical Course User Index [MH] Margrette Monte A, PA-C    23 y.o. female presents to the emergency department for OB  U/S. See HPI for further details.   Differential diagnosis includes, but is not limited to fetal demise, Threatened miscarriage    Patient's presentation is most consistent with acute complicated illness / injury requiring diagnostic workup.  Patient is alert and oriented.  She is hemodynamically stable.  Plan to obtain hCG. Ultrasound ordered in triage. I presume if normal ultrasound patient will be discharged home with OB follow-up.  Zofran  administered for nausea.  Ultrasound reassuring.  Patient stable for outpatient management.  Advised to follow-up with OB/GYN for further management.  ED return precaution discussed.   FINAL CLINICAL IMPRESSION(S) / ED DIAGNOSES   Final diagnoses:  Ultrasound scan done for inability to hear fetal heart tones     Rx / DC Orders   ED Discharge Orders          Ordered    ondansetron  (ZOFRAN -ODT) 4 MG disintegrating tablet  Every 8 hours PRN        07/10/24 1549             Note:  This document was prepared using Dragon voice recognition software and may include unintentional dictation errors.    Margrette, Letcher Schweikert A, PA-C 07/10/24 1603    Clarine Ozell LABOR, MD 07/10/24 2009

## 2024-07-23 ENCOUNTER — Encounter: Payer: Self-pay | Admitting: Physician Assistant

## 2024-07-23 ENCOUNTER — Telehealth: Payer: Self-pay | Admitting: Family Medicine

## 2024-07-23 NOTE — Telephone Encounter (Signed)
 Please refer to message sent to client regarding US  appt. Burnadette Lowers, RN

## 2024-08-06 NOTE — Progress Notes (Signed)
 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 We discussed the option of carrier screening for Brenda Mcclure's partner; he was not present for today's appointment. She will review the options with him and will contact our office if screening is desired.   2. The patient declined amniocentesis.   Family, Pregnancy and Medical History:   The complete family history was reviewed. Consanguinity was denied. The following findings were discussed:    Autism: Brenda Mcclure reported that her maternal aunt has a son (6y) with significant autism. She has limited information about this history. Autism is characterized by differences in social interaction, communication, and repetitive behaviors. It is often associated with co-occurring conditions such as intellectual disability, ADHD, and epilepsy. Up to 25% of individuals on the autism spectrum have an identifiable genetic cause, with a higher likelihood when intellectual disability, developmental delay, or distinctive physical features are present.  Brenda Mcclure is uncertain if her cousin has been evaluated by a geneticist or if an underlying cause has been identified. The risk for idiopathic autism in fourth-degree relatives is unclear. We discussed the recommendation to consider carrier screening for fragile X syndrome based on this family history. The patient previously had low risk carrier screening for fragile X syndrome (specific result for condition not available). Prenatal or presymptomatic diagnosis is not currently available  for autism of unknown etiology.  As reported, the family history is otherwise negative for birth defects, intellectual disability, multiple miscarriages, or known genetic conditions.   The patient and her partner are Hispanic ([place]).  G62P1001 Maternal age at EDD is 4.  Current gestational age is estimated at [redacted]w[redacted]d with an Estimated Date of Delivery: 01/05/25. The patient reported no significant exposures to alcohol, tobacco, or other known teratogens in the pregnancy.  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 Results were reported by [facility]. She indicated that her partner has not had any carrier screening. She denied any family history of either condition.  Gitelman syndrome (GS) is a renal tubular disorder characterized by hypokalemia, hypomagnesemia, hypocalciuria, metabolic alkalosis, secondary hyperreninemic aldosteronism, and low blood pressure. Common features of this condition include tetany, muscle weakness or cramping, dizziness, and salt craving. Also common is paresthesia most often affecting the face. Some individuals with Gitelman syndrome experience fatigue and chondrocalcinosis. Studies suggest that Gitelman syndrome may also increase the risk of ventricular arrhythmia.   VLCAD deficiency is an inherited metabolic disease characterized by lethargy, weakness, and low blood sugar. Individuals with VLCAD deficiency have deficiencies of the very long-chain acyl-CoA dehydrogenase enzyme, which helps to break down very long-chain fatty acids that are used for energy in cells. Symptoms associated with VLCAD deficiency are due to low levels of energy and the toxic build-up of  fatty acids in cells, especially in the heart, liver, and muscles.  We discussed that both of these disorders are inherited in an autosomal  recessive manner. *  We discussed that the carrier frequency of VLCAD deficiency in the general population in the [place]  is 1/86. If both partners are VLCAD carriers, there is a 1 in 4 chance for each of their pregnancies to be affected.  At this time, the risk of VLCAD deficiency in the current pregnancy is 1 in 344 (1 x 1/2 x 1/86 x 1/2).   The carrier frequency of GS deficiency in the general population in the [place]  is 1/100. If both partners are GS carriers, there is a 1 in 4 chance for each of their pregnancies to be affected.  At this time, the risk of GS in the current pregnancy is 1 in 400 (1 x 1/2 x 1/100 x 1/2).   We discussed the availability of paternal carrier testing and prenatal diagnosis for both VLCAD and GS. The option of carrier screening on Brenda Mcclure's partner was reviewed in detail; he was not present for today's appointment. She will review the options with him and contact our office if he desires carrier screening.   Prenatal diagnostic testing was also declined. The patient was advised that newborn screening in [place]  does  include VLCAD but does NOT include GS.   It was emphasized that even normal test results cannot guarantee a healthy pregnancy. Three to five percent of all newborns may have significant physical or mental differences, many of which are undetectable through any known prenatal diagnostic techniques. The case was reviewed with Dr. Jeanetta who was immediately available in clinic. I personally spent 46 minutes face-to-face and non-face-to-face in the care of this patient, which includes all pre, intra, and post visit time on the date of service.   *Some images could not be shown.

## 2024-08-07 ENCOUNTER — Encounter: Payer: Self-pay | Admitting: Family Medicine

## 2024-08-07 ENCOUNTER — Ambulatory Visit: Admitting: Family Medicine

## 2024-08-07 VITALS — BP 112/66 | HR 92 | Temp 97.5°F | Wt 169.6 lb

## 2024-08-07 DIAGNOSIS — J45909 Unspecified asthma, uncomplicated: Secondary | ICD-10-CM

## 2024-08-07 DIAGNOSIS — O285 Abnormal chromosomal and genetic finding on antenatal screening of mother: Secondary | ICD-10-CM

## 2024-08-07 DIAGNOSIS — O99212 Obesity complicating pregnancy, second trimester: Secondary | ICD-10-CM

## 2024-08-07 DIAGNOSIS — O99210 Obesity complicating pregnancy, unspecified trimester: Secondary | ICD-10-CM

## 2024-08-07 DIAGNOSIS — Z3A18 18 weeks gestation of pregnancy: Secondary | ICD-10-CM

## 2024-08-07 DIAGNOSIS — Z98891 History of uterine scar from previous surgery: Secondary | ICD-10-CM

## 2024-08-07 DIAGNOSIS — F32A Depression, unspecified: Secondary | ICD-10-CM

## 2024-08-07 DIAGNOSIS — Z3480 Encounter for supervision of other normal pregnancy, unspecified trimester: Secondary | ICD-10-CM

## 2024-08-07 DIAGNOSIS — Z3482 Encounter for supervision of other normal pregnancy, second trimester: Secondary | ICD-10-CM

## 2024-08-07 NOTE — Progress Notes (Signed)
 [facility] [address] [place] [address] Main phone: [phone]  Prenatal Visit  Subjective:  Brenda Mcclure is a 23 y.o. G2P1001 at [redacted]w[redacted]d being seen today for ongoing prenatal care.  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 Pt Denies leaking of fluid/ROM.   At last appt 9/19 at [facility], unable to hear FHR, was sent to ED.   ED visit: 07/10/24 US  assess FHTs, Beta hCG: 29,251 -IMPRESSION: 1. Single live intrauterine pregnancy as above, estimated age 31 weeks and 4 days.   The following portions of the patient's history were reviewed and updated as appropriate: allergies, current medications, past family history, past medical history, past social history, past surgical history and problem list. Problem list updated.  Objective:   Vitals:   08/07/24 1025  BP: 112/66  Pulse: 92  Temp: (!) 97.5 F (36.4 C)  Weight: 169 lb 9.6 oz (76.9 kg)   Fetal Status: Fetal Heart Rate (bpm): 153 Fundal Height: 19 cm Movement:  (Pt states she feels  flutters)     General:  Alert, oriented and cooperative. Patient is in no acute distress.  Skin: Skin is warm and dry. No rash noted.   Cardiovascular: Normal heart  rate noted  Respiratory: Normal respiratory effort, no problems with respiration noted  Abdomen: Soft, gravid, appropriate for gestational age.  Pain/Pressure: Absent     Pelvic: Cervical exam deferred        Extremities: Normal range of motion.     Mental Status: Normal mood and affect. Normal behavior. Normal judgment and thought content.   Assessment and Plan:  Pregnancy: G2P1001 at [redacted]w[redacted]d  1. [redacted] weeks gestation of pregnancy (Primary)  -RV in 4 weeks  - AFP, Serum, Open Spina Bifida  2. Supervision of other normal pregnancy, antepartum  At last visit, unable to hear FHR; was sent to ED with reassuring workup. Pt doing fine so far in pregnancy. Pt presents for RV.  BP: 112/66 WNL. TWG: 9 lb 9.6 oz (4.355 kg),  gained 1 lb in 2 weeks. Continues to take PNV without concerns.  Fetal movement noted, fundal height appropriate for GA. No concerns at visit today.  Collected AFP at visit today.  Anatomy scan scheduled at [facility] on 08/19/24.  Pt had no additional concerns.   3. Obesity in pregnancy -Continues Asprin everyday, pt aware of continuing a balance diet to support healthy pregnancy weight gain. Will continue to monitor weight and provide on going counseling as needed for maternal and fetal health. Referral placed to MNT (08/07/24).   - Amb ref to Medical Nutrition Therapy-MNT  4. History of C-section - Delivery plans to be completed by [facility] ~31-32 weeks. Placed referral today via [facility].   5. Asthma, unspecified asthma severity, unspecified whether complicated, unspecified  whether persistent - Last use of albuterol  inhaler was  3 days ago per pt report. She states when she laughs she starts wheezing. Pt advised to continue utilizing albuterol  inhaler PRN.   - Pt is aware of appointment at [facility] for PFTs on 08/28/24 and [facility] pulmonology appointment.   6. Depression, unspecified depression type Pt doing well today, no signs and symptoms of depression at visit today. Has mom present  with her for support. No current medication use for depression. No concerns.   7. Abnormal genetic test in pregnancy Met with genetic counselor on 08/06/24. Further educational information and guidance was provided in regards to Gitelman syndrome and long-chain acyl-coenzyme A dehydrogenase deficiency.    Preterm labor symptoms and general obstetric precautions including but not limited to vaginal bleeding, contractions, leaking of fluid and fetal movement were reviewed in detail with the patient. Please refer to After Visit Summary for other counseling recommendations.  Return in about 4 weeks (around 09/04/2024) for next routine prenatal visit.  Future Appointments  Date Time Provider Department Center  09/04/2024  9:00 AM AC-MH PROVIDER AC-MAT None   Alexia Trudy [facility], MPH   Attestation of Supervision of Advanced Practitioner (CNM/PA/NP): Evaluation and management procedures were performed by the Advanced Practice Provider under my supervision and collaboration.  I have reviewed the Advanced Practice Provider's note and chart, and I agree with the management and plan. I have also made any necessary editorial changes.   I was working along side this practitioner all day and all medical plans were discussed with me.   Betsey CHRISTELLA Helling, MD

## 2024-08-07 NOTE — Progress Notes (Signed)
 Client elects [facility]  for post-partum BCM. Verified has [facility] contact card. Kept 08/06/25 [facility] genetic counseling appt (client changed appt from 07/31/24). Aware of 08/19/24 [facility] OB US  appt and 08/28/24 / 09/09/24 Pulmonology Clinic appts. Desires AFP testing today. Burnadette Lowers, RN

## 2024-08-09 LAB — AFP, SERUM, OPEN SPINA BIFIDA
AFP MoM: 0.94
AFP Value: 38.7 ng/mL
Gest. Age on Collection Date: 18.3 wk
Maternal Age At EDD: 25.6 a
OSBR Risk 1 IN: 10000
Test Results:: NEGATIVE
Weight: 170 [lb_av]

## 2024-08-11 ENCOUNTER — Ambulatory Visit: Payer: Self-pay | Admitting: Family Medicine

## 2024-08-11 DIAGNOSIS — Z3480 Encounter for supervision of other normal pregnancy, unspecified trimester: Secondary | ICD-10-CM

## 2024-09-04 ENCOUNTER — Ambulatory Visit: Admitting: Nurse Practitioner

## 2024-09-04 ENCOUNTER — Encounter: Payer: Self-pay | Admitting: Nurse Practitioner

## 2024-09-04 VITALS — BP 117/70 | HR 81 | Temp 98.0°F | Wt 175.8 lb

## 2024-09-04 DIAGNOSIS — F32A Depression, unspecified: Secondary | ICD-10-CM

## 2024-09-04 DIAGNOSIS — Z3480 Encounter for supervision of other normal pregnancy, unspecified trimester: Secondary | ICD-10-CM

## 2024-09-04 DIAGNOSIS — Z98891 History of uterine scar from previous surgery: Secondary | ICD-10-CM

## 2024-09-04 DIAGNOSIS — Z3482 Encounter for supervision of other normal pregnancy, second trimester: Secondary | ICD-10-CM

## 2024-09-04 DIAGNOSIS — Z3A22 22 weeks gestation of pregnancy: Secondary | ICD-10-CM

## 2024-09-04 DIAGNOSIS — O99212 Obesity complicating pregnancy, second trimester: Secondary | ICD-10-CM

## 2024-09-04 DIAGNOSIS — J45909 Unspecified asthma, uncomplicated: Secondary | ICD-10-CM

## 2024-09-04 DIAGNOSIS — O99210 Obesity complicating pregnancy, unspecified trimester: Secondary | ICD-10-CM

## 2024-09-04 NOTE — Progress Notes (Signed)
 [facility] [address] [place] [address] Main phone: [phone]  Prenatal Visit  Subjective:  Brenda Mcclure is a 23 y.o. G2P1001 at [redacted]w[redacted]d being seen today for ongoing prenatal care.  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 Pt reports an increase in sugar intake and lower back pain that improved with stretching and repositioning. She endorses additionally experiencing round ligament pain; position changes and maternity belt provide relief. Pt Denies leaking of fluid/ROM and vaginal bleeding.   The following portions of the patient's history were reviewed and updated as appropriate: allergies, current medications, past family history, past medical history, past social history, past surgical history and problem list. Problem list updated.  Objective:   Vitals:   09/04/24 0923  BP: 117/70  Pulse: 81  Temp: 98 F (36.7 C)  Weight: 175 lb 12.8 oz (79.7 kg)    Fetal Status: Fetal Heart Rate (bpm): 152 Fundal Height: 22 cm Movement: Present     General:  Alert, oriented and cooperative. Patient is in no acute distress.  Skin: Skin is warm and dry. No rash noted.   Cardiovascular: Normal heart rate noted   Respiratory: Normal respiratory effort, no problems with respiration noted  Abdomen: Soft, gravid, appropriate for gestational age.  Pain/Pressure: Absent (only round ligament pain per pt.)     Pelvic: Cervical exam deferred        Extremities: Normal range of motion.  Edema: None  Mental Status: Normal mood and affect. Normal behavior. Normal judgment and thought content.   Assessment and Plan:  Pregnancy: G2P1001 at [redacted]w[redacted]d  1. [redacted] weeks gestation of pregnancy (Primary) -RV in 4 weeks.   2. Supervision of other normal pregnancy, antepartum Continues to take PNV without concerns.  Fetal movement noted, fundal height appropriate for GA. No concerns at visit today.    10/29- anatomy US   presentation: transverse, anterior placenta, AFI normal, no fetal anomalies were visualized, but heart was not adequately visualized.  -Pt was recommended to return in 4 weeks to complete anatomy on (09/11/24). -Reinforced adequate hydration, decreasing sugar in diet and use of tylenol  as needed for headaches management.  -Advised on continuous use of maternity belt, proper position changes and stretching for lower back pain and round ligament pain. -She inquired seeking out chiropractic services during pregnancy. We reviewed risks that can be associated with seeking out this care. Advised pt at her own discretion about chiropractic services in pregnancy. Reviewed that there was not a specific chiropractor that I could endorse. Pt reports will do more research on her end pertaining to this service.   3. Obesity in pregnancy TWG: 15 lb 12.8 oz (7.167 kg). Has gained 7 lbs in 4 weeks.  - Reviewed with pt benefits of regular moderate intensity exercise 20-30 minutes per  day and a balance diet adding fruits and vegetables and cutting down on sugary snacks to support healthy pregnancy weight gain. Will continue to monitor weight and provided ongoing counseling as needed for maternal and fetal health.    4. History  of C-section - Delivery plans appt with [facility] [place] on 12/[facility]. Pt encouraged to keep this appt.   5. Asthma, unspecified asthma severity, unspecified whether complicated, unspecified whether persistent - Uses inhaler once a week only when she laughs. She reports wheezing with laughing last experienced two days ago.  -Missed appt with pulmonology on 11/7 - rescheduled for January. She is on the wait list for [facility] pulmonology if an appt opens up sooner. -Pt presents in stable condition in clinic today good respiratory effort noted, no shortness of breath or chest tightening.   6. Depression, unspecified depression type -Presents today with partner present for support. She reports good mood lately. No SI/HI. Will continue to monitor.   Preterm labor symptoms and general obstetric precautions including but not limited to vaginal bleeding, contractions, leaking of fluid and fetal movement were reviewed in detail with the patient. Please refer to After Visit Summary for other counseling recommendations.  No follow-ups on file.  Future Appointments  Date Time Provider Department Center  10/02/2024  1:20 PM AC-MH PROVIDER AC-MAT None    Claris Pech GORMAN Pouch, NP  Attestation of Supervision of Advanced Practitioner (CNM/PA/NP): Evaluation and management procedures were performed by the Advanced Practice Provider under my supervision and collaboration.  I have reviewed the Advanced Practice Provider's note and chart, and I agree with the management and plan. I have also made any necessary editorial changes.   I was working along side this practitioner all day and all medical plans were discussed with me.   Laura K Livingston, NP

## 2024-09-07 ENCOUNTER — Other Ambulatory Visit: Payer: Self-pay | Admitting: Family Medicine

## 2024-09-07 ENCOUNTER — Encounter: Payer: Self-pay | Admitting: Family Medicine

## 2024-09-07 NOTE — Progress Notes (Signed)
 Note from MNT:  Client did not show for the 09/04/24 scheduled appointment.  Will attempt to reschedule.

## 2024-10-01 ENCOUNTER — Ambulatory Visit

## 2024-10-01 VITALS — BP 104/65 | HR 86 | Temp 97.8°F | Wt 179.0 lb

## 2024-10-01 DIAGNOSIS — O99210 Obesity complicating pregnancy, unspecified trimester: Secondary | ICD-10-CM

## 2024-10-01 DIAGNOSIS — J45909 Unspecified asthma, uncomplicated: Secondary | ICD-10-CM

## 2024-10-01 DIAGNOSIS — O99212 Obesity complicating pregnancy, second trimester: Secondary | ICD-10-CM

## 2024-10-01 DIAGNOSIS — Z3480 Encounter for supervision of other normal pregnancy, unspecified trimester: Secondary | ICD-10-CM

## 2024-10-01 DIAGNOSIS — Z3482 Encounter for supervision of other normal pregnancy, second trimester: Secondary | ICD-10-CM

## 2024-10-01 DIAGNOSIS — Z98891 History of uterine scar from previous surgery: Secondary | ICD-10-CM

## 2024-10-01 DIAGNOSIS — Z3A26 26 weeks gestation of pregnancy: Secondary | ICD-10-CM

## 2024-10-01 NOTE — Progress Notes (Signed)
°  [facility] [address] [place] [address] Main phone: [phone]  Prenatal Visit  Subjective:  Brenda Mcclure is a 23 y.o. G2P1001 at [redacted]w[redacted]d being seen today for ongoing prenatal care.  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  Contractions: Not present. Vag. Bleeding: None.  Movement: Present. Denies leaking of fluid/ROM.   The following portions of the patient's history were reviewed and updated as appropriate: allergies, current medications, past family history, past medical history, past social history, past surgical history and problem list. Problem list updated.  Objective:   Vitals:   10/01/24 1400  BP: 104/65  Pulse: 86  Temp: 97.8 F (36.6 C)  Weight: 179 lb (81.2 kg)   Total weight gain from pre-pregnancy weight: 19 lb (8.618 kg)  Fetal Status: Fetal Heart Rate (bpm): 140 Fundal Height: 26 cm Movement: Present    Fundal height trends reviewed - appropriate for EGA  General:  Alert, oriented and cooperative. Patient is in no acute distress.  Skin: Skin is warm and dry. No rash noted.   Cardiovascular: Normal heart rate noted  Respiratory: Normal respiratory effort, no problems with respiration noted  Abdomen: Soft, gravid, appropriate for gestational age.  Pain/Pressure: Absent     Pelvic: Cervical exam deferred        Extremities: Normal range of  motion.     Mental Status: Normal mood and affect. Normal behavior. Normal judgment and thought content.   Assessment and Plan:  Pregnancy: G2P1001 at [redacted]w[redacted]d  1. [redacted] weeks gestation of pregnancy (Primary)  2. Supervision of other normal pregnancy, antepartum  - Doing well, no complaints - Discussed anticipating glucose tolerance test/labs, Tdap at next visit  3. History of C-section  - Delivery plans appt on 10/21/24, anticipating c[facility]section at [facility]  4. 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 Reports good appetite and eating healthy diet. - Weight gain so far 19 lbs, patient expressed concern. Discussed continuing her healthy habits. - Taking aspirin  and PNV  5. Asthma, unspecified asthma severity, unspecified whether  complicated, unspecified whether persistent  - Asthma good per patient, cannot recall last inhaler use, maybe 2-3 weeks ago - Missed her appointment for pulmonary functioning testing in November  Preterm labor symptoms and general obstetric precautions including but not limited to vaginal bleeding, contractions, leaking of fluid and fetal movement were reviewed in detail with the patient. Please refer to After Visit Summary for other counseling recommendations.   Return in about 2 weeks (around 10/15/2024).  Future Appointments  Date Time Provider Department Center  10/19/2024  8:20 AM AC-MH PROVIDER AC-MAT None   Damien FORBES Satchel, NP

## 2024-10-01 NOTE — Progress Notes (Signed)
 Pt here for visit 26 2/7GA. Reports no bleeding no cramping, good fetal movement. No swelling, no nausea, no pain. Has no questions and requesting note with GA and EDC for work. Review PTL w/pt. Reviewed findings with provider. Completed U/S and all finding were normal according to patient. No follow up needed. Rico DELENA Aden, RN

## 2024-10-02 ENCOUNTER — Ambulatory Visit

## 2024-10-19 ENCOUNTER — Ambulatory Visit: Admitting: Nurse Practitioner

## 2024-10-19 ENCOUNTER — Encounter: Payer: Self-pay | Admitting: Nurse Practitioner

## 2024-10-19 VITALS — BP 106/64 | HR 91 | Temp 97.3°F | Wt 183.2 lb

## 2024-10-19 DIAGNOSIS — J45909 Unspecified asthma, uncomplicated: Secondary | ICD-10-CM

## 2024-10-19 DIAGNOSIS — Z3480 Encounter for supervision of other normal pregnancy, unspecified trimester: Secondary | ICD-10-CM

## 2024-10-19 DIAGNOSIS — Z23 Encounter for immunization: Secondary | ICD-10-CM

## 2024-10-19 DIAGNOSIS — Z3483 Encounter for supervision of other normal pregnancy, third trimester: Secondary | ICD-10-CM

## 2024-10-19 DIAGNOSIS — Z98891 History of uterine scar from previous surgery: Secondary | ICD-10-CM

## 2024-10-19 DIAGNOSIS — Z3A28 28 weeks gestation of pregnancy: Secondary | ICD-10-CM

## 2024-10-19 DIAGNOSIS — O99213 Obesity complicating pregnancy, third trimester: Secondary | ICD-10-CM

## 2024-10-19 DIAGNOSIS — O99210 Obesity complicating pregnancy, unspecified trimester: Secondary | ICD-10-CM

## 2024-10-19 LAB — HEMOGLOBIN, FINGERSTICK: Hemoglobin: 11.1 g/dL (ref 11.1–15.9)

## 2024-10-19 NOTE — Progress Notes (Addendum)
 " [facility] [address] [place] [address] Main phone: [phone]  Prenatal Visit  Subjective:  Brenda Mcclure is a 23 y.o. G2P1001 at [redacted]w[redacted]d being seen today for ongoing prenatal care.  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  Contractions: Not present. Vag. Bleeding: None.  Movement: Present. Denies leaking of fluid/ROM.   The following portions of the patient's history were reviewed and updated as appropriate: allergies, current medications, past family history, past medical history, past social history, past surgical history and problem list. Problem list updated.  Objective:   Vitals:   10/19/24 0819  BP: 106/64  Pulse: 91  Temp: (!) 97.3 F (36.3 C)  Weight: 183 lb 3.2 oz (83.1 kg)   Total weight gain from pre-pregnancy weight: 23 lb 3.2 oz (10.5 kg)  Fetal Status: Fetal Heart Rate (bpm): 135 Fundal Height: 28 cm Movement: Present    Fundal height trends reviewed - appropriate for EGA  General:  Alert, oriented and cooperative. Patient is in no acute distress.  Skin: Skin is warm and dry. No rash noted.   Cardiovascular: Normal heart rate noted  Respiratory: Normal respiratory effort, no problems with respiration noted  Abdomen: Soft, gravid, appropriate for gestational age.  Pain/Pressure: Absent     Pelvic: Cervical exam  deferred        Extremities: Normal range of motion.  Edema: None  Mental Status: Normal mood and affect. Normal behavior. Normal judgment and thought content.   Assessment and Plan:  Pregnancy: G2P1001 at [redacted]w[redacted]d  1. [redacted] weeks gestation of pregnancy (Primary)  - Glucose, 1 hour - HIV-1/HIV-2 Qualitative RNA - RPR W/RFLX TO RPR TITER, TREPONEMAL AB, SCREEN AND DIAGNOSIS - Tdap vaccine greater than or equal to 7yo IM - Hemoglobin, venipuncture  Signs and symptoms of preeclampsia were verbally reviewed with warning signs, when and how to call. A written handout with tips on how to take your blood pressure, as well as warning signs was provided to the patient.    2. Supervision of other normal pregnancy, antepartum  - Doing well overall - Cloudy urine without UTI sx: patient reports not drinking enough water. Advised 6-8 glasses/day. - Has decided she wants a BTL, consents signed today and she will discuss at [facility] appt on 12/31 - EPDS 7, reports mood as good and has no concerns - CMHRP risk form completed, no concerns  3. Obesity in pregnancy  - TWG 23 lbs - Taking aspirin  daily  4. Asthma, unspecified asthma severity, unspecified whether complicated, unspecified whether persistent  - Reports asthma symptoms are good, cannot recall last use of inhaler - has been a while - Missed pulmonary appointment back in November, does not want to reschedule  5. History of C-section  - Delivery plans appt scheduled for 12/31, plans repeat w/ BTL  Preterm labor symptoms and general obstetric precautions including but not limited to vaginal bleeding, contractions,  leaking of fluid and fetal movement were reviewed in detail with the patient. Please refer to After Visit Summary for other counseling recommendations.   Return in about 2 weeks (around 11/02/2024).  No future appointments.  Damien FORBES Satchel, NP "

## 2024-10-19 NOTE — Progress Notes (Signed)
 Patient states she does not desire to reschedule appointment with pulmonologist. In house hgb result reviewed in clinic. Patient aware of upcoming appointment 12/31. Patient states she desires BTL. Consent signed and copy given to patient. [facility] Forms completed. BTHIELE RN

## 2024-10-20 LAB — GLUCOSE, 1 HOUR GESTATIONAL: Gestational Diabetes Screen: 96 mg/dL (ref 70–139)

## 2024-10-20 LAB — SYPHILIS: RPR W/REFLEX TO RPR TITER AND TREPONEMAL ANTIBODIES, TRADITIONAL SCREENING AND DIAGNOSIS ALGORITHM: RPR Ser Ql: NONREACTIVE

## 2024-10-20 LAB — HIV-1/HIV-2 QUALITATIVE RNA
HIV-1 RNA, Qualitative: NONREACTIVE
HIV-2 RNA, Qualitative: NONREACTIVE

## 2024-10-21 ENCOUNTER — Ambulatory Visit: Payer: Self-pay | Admitting: Family Medicine

## 2024-10-21 DIAGNOSIS — Z3480 Encounter for supervision of other normal pregnancy, unspecified trimester: Secondary | ICD-10-CM

## 2024-10-21 NOTE — Progress Notes (Signed)
 Reviewed routine 28 week labs. Negative 1 hr GTT, HIV, RPR, and anemia screen. No action needed at this time. To be discussed at next routine prenatal appointment.   Dorothyann Helling, MD 10/21/2024  11:12 AM

## 2024-11-02 NOTE — Addendum Note (Signed)
 Addended by: Tam Delisle on: 11/02/2024 02:34 PM   Modules accepted: Orders

## 2024-11-06 ENCOUNTER — Ambulatory Visit

## 2024-11-06 VITALS — BP 117/70 | HR 96 | Temp 96.9°F | Wt 185.2 lb

## 2024-11-06 DIAGNOSIS — Z98891 History of uterine scar from previous surgery: Secondary | ICD-10-CM

## 2024-11-06 DIAGNOSIS — O99210 Obesity complicating pregnancy, unspecified trimester: Secondary | ICD-10-CM

## 2024-11-06 DIAGNOSIS — Z3480 Encounter for supervision of other normal pregnancy, unspecified trimester: Secondary | ICD-10-CM

## 2024-11-06 DIAGNOSIS — Z3A31 31 weeks gestation of pregnancy: Secondary | ICD-10-CM

## 2024-11-06 DIAGNOSIS — J45909 Unspecified asthma, uncomplicated: Secondary | ICD-10-CM

## 2024-11-06 DIAGNOSIS — O99213 Obesity complicating pregnancy, third trimester: Secondary | ICD-10-CM

## 2024-11-06 DIAGNOSIS — Z3483 Encounter for supervision of other normal pregnancy, third trimester: Secondary | ICD-10-CM

## 2024-11-06 NOTE — Progress Notes (Signed)
 " [facility] [address] [place] [address] Main phone: [phone]  Prenatal Visit  Subjective:  Brenda Mcclure is a 24 y.o. G2P1001 at [redacted]w[redacted]d being seen today for ongoing prenatal care.  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  Contractions: Irregular (BH resolves with rest). Vag. Bleeding: None.  Movement: Present. Pt Denies leaking of fluid/ROM.   The following portions of the patient's history were reviewed and updated as appropriate: allergies, current medications, past family history, past medical history, past social history, past surgical history and problem list. Problem list updated.  Objective:   Vitals:   11/06/24 1314  BP: 117/70  Pulse: 96  Temp: (!) 96.9 F (36.1 C)  Weight: 185 lb 3.2 oz (84 kg)   Total weight gain from pre-pregnancy weight: 25 lb 3.2 oz (11.4 kg)  Fetal Status: Fetal Heart Rate (bpm): 153 Fundal Height: 32 cm Movement: Present    Fundal height trends reviewed - appropriate for EGA  General:  Alert, oriented and cooperative. Patient is in no acute distress.  Skin: Skin is warm and dry. No rash noted.   Cardiovascular: Normal heart rate noted  Respiratory: Normal respiratory effort, no problems with respiration noted  Abdomen: Soft, gravid, appropriate for gestational age.  Pain/Pressure: Present (Pelvic pressure only after a  long day when she on her feet.)     Pelvic: Cervical exam deferred        Extremities: Normal range of motion.  Edema: None  Mental Status: Normal mood and affect. Normal behavior. Normal judgment and thought content.   Assessment and Plan:  Pregnancy: G2P1001 at [redacted]w[redacted]d  1. [redacted] weeks gestation of pregnancy (Primary) -RV in 2 weeks.  2. Supervision of other normal pregnancy, antepartum  -Pt doing well.  - Continues to take PNV without concerns.  -Desires BTL has signed appropriate sterilization consent forms.  -Fetal movement noted, fundal height appropriate for GA.  -No concerns at visit today.    3. Obesity in pregnancy -TWG: 25 lb 3.2 oz (11.4 kg) Has gained 2 lbs in 2 weeks.  - Reviewed with pt benefits of regular moderate intensity exercise 20-30 minutes per day and a balance diet to support healthy pregnancy weight gain.  -Will continue to monitor weight and provided ongoing counseling as needed for maternal and fetal health.    4. History of C-section -Delivery plans appt with [facility] was on 10/21/24- Planning repeat ces[facility] at [facility]  5. Asthma, unspecified asthma severity, unspecified whether complicated, unspecified whether persistent -Denies chest pain,chest tightness or shortness of breath. -Has not had to utilize PRN inhaler.   Preterm labor symptoms and general obstetric precautions including but not limited to vaginal bleeding, contractions, leaking of fluid and fetal movement were reviewed in detail with the patient. Please refer to After Visit Summary for other counseling recommendations.  Return in about 2 weeks (around 11/20/2024) for routine prenatal care.  Future Appointments  Date  Time Provider Department Center  11/20/2024 11:00 AM AC-MH PROVIDER AC-MAT None     Jabar Krysiak GORMAN Pouch, NP "

## 2024-11-20 ENCOUNTER — Ambulatory Visit

## 2024-11-20 ENCOUNTER — Telehealth: Payer: Self-pay

## 2024-11-20 NOTE — Telephone Encounter (Signed)
TC to patient to reschedule missed appointment. LM with number to call..Jaedah Lords Brewer-Jensen, RN 

## 2024-11-25 ENCOUNTER — Telehealth: Payer: Self-pay | Admitting: Family Medicine

## 2024-11-25 NOTE — Telephone Encounter (Signed)
 Phone call to patient for phone note regarding patient complaint of rash. No answer and left message to call to recommend that patient come in for evaluation of rash tomorrow or Friday. Interpretation provided by [facility] [phone]. Niels Devonshire, RN

## 2024-11-25 NOTE — Telephone Encounter (Signed)
 Patient returned phone call. Rescheduled for evaluation of rash for 11/26/24 @ 3:00. Patient verbalized understanding. Niels Devonshire, RN

## 2024-11-26 ENCOUNTER — Ambulatory Visit

## 2024-11-26 ENCOUNTER — Telehealth: Payer: Self-pay | Admitting: Family Medicine

## 2024-11-26 ENCOUNTER — Encounter: Payer: Self-pay | Admitting: Family Medicine

## 2024-11-26 VITALS — BP 109/68 | HR 90 | Temp 97.5°F | Wt 187.2 lb

## 2024-11-26 DIAGNOSIS — L282 Other prurigo: Secondary | ICD-10-CM | POA: Insufficient documentation

## 2024-11-26 DIAGNOSIS — Z3480 Encounter for supervision of other normal pregnancy, unspecified trimester: Secondary | ICD-10-CM

## 2024-11-26 DIAGNOSIS — Z98891 History of uterine scar from previous surgery: Secondary | ICD-10-CM

## 2024-11-26 MED ORDER — BETAMETHASONE VALERATE 0.1 % EX CREA: TOPICAL_CREAM | Freq: Two times a day (BID) | CUTANEOUS | 0 refills | Status: AC

## 2024-11-26 NOTE — Assessment & Plan Note (Signed)
 Doing well. BP normal. TWG 27 lb 3.2 oz (12.3 kg). Taking prenatal vitamin daily and aspirin  81 mg daily. Next prenatal appointment in 1 weeks.

## 2024-11-26 NOTE — Patient Instructions (Addendum)
 Your skin rash looks most like PUPPP rash. It is a relatively common inflammatory reaction in pregnancy.  - start the betamethasone  cream over any affected areas once or twice daily  - use an antihistamine for the itching (like benadryl , claritin , or similar)  We ALWAYS rule out

## 2024-11-26 NOTE — Telephone Encounter (Signed)
 Return call to client with [facility] [employer] ID # [id number] and left message requesting she return RN call. Number to call provided. Burnadette Lowers, RN

## 2024-11-26 NOTE — Assessment & Plan Note (Addendum)
 Approx one week duration. Most consistent with PUPP, but will rule out ICP. Discussed treatment of PUPPP with patient, she prefers conservative approach with topical steroid cream and oral antihistamine first. If insufficient relief, will try short course of oral steroids. Will follow labs.

## 2024-11-26 NOTE — Progress Notes (Signed)
 Here for OB problem visit at 34 2/7.  States she has an itchy red rash, on arms, back of legs, belly and behind her ears. Hulan Parish, RN

## 2024-11-26 NOTE — Telephone Encounter (Signed)
 Client returned RN call. Client has OB Problem appt this pm due to new onset of pruritic rash and due to picking up child from school needs later appt. Client to arrive at 1530 and aware to be worked in for appt. Burnadette Lowers, RN

## 2024-11-26 NOTE — Assessment & Plan Note (Signed)
 rLTCS on 12/29/2024.

## 2024-11-26 NOTE — Progress Notes (Signed)
 " [facility] [address] [place] [address] Main phone: [phone]  Prenatal Visit - problem visit  Subjective:  Brenda Mcclure is a 24 y.o. G2P1001 at [redacted]w[redacted]d being seen today for ongoing prenatal care.  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  Contractions: Not present. Vag. Bleeding: None.  Movement: Present. Denies leaking of fluid/ROM.   Pruritic rash - one week duration - started on abdomen; umbilicus is pruritic but without lesion - lesions in various stages of healing - located on abdomen, dorsal upper thighs, forearms, behind ears, palms (scant) - no recent travel in herself of household - no new medications or uncooked foods  The following portions of the patient's history were reviewed and updated as appropriate: allergies, current medications, past family history, past medical history, past social history, past surgical history and problem list. Problem list updated.  Objective:   Vitals:   11/26/24 1618  BP: 109/68  Pulse: 90  Temp: (!) 97.5 F (36.4 C)  Weight: 187 lb 3.2 oz (84.9 kg)   Total weight gain from pre-pregnancy weight: 27 lb 3.2 oz (12.3 kg)  Fetal Status: Fetal Heart Rate (bpm): 150 Fundal Height: 35 cm Movement: Present    Fundal height trends reviewed - appropriate for EGA  General:  Alert, oriented  and cooperative. Patient is in no acute distress.  Skin: Skin is warm and dry. Scabbed, dry reddish papules in various stages of healing across abdomen, forearms, neck, and dorsal thighs  Cardiovascular: Normal heart rate noted  Respiratory: Normal respiratory effort, no problems with respiration noted  Abdomen: Soft, gravid, appropriate for gestational age.  Pain/Pressure: Absent     Pelvic: Cervical exam deferred        Extremities: Normal range of motion.  Edema: Mild pitting, slight indentation  Mental Status: Normal mood and affect. Normal behavior. Normal judgment and thought content.   Assessment and Plan:  Pregnancy: G2P1001 at [redacted]w[redacted]d  Pruritic rash Assessment & Plan: Approx one week duration. Most consistent with PUPP, but will rule out ICP. Discussed treatment of PUPPP with patient, she prefers conservative approach with topical steroid cream and oral antihistamine first. If insufficient relief, will try short course of oral steroids. Will follow labs.   Orders: -     Bile acids, total -     Comprehensive metabolic panel with GFR -     Betamethasone  Valerate; Apply topically 2 (two) times daily. Apply to affected areas once or twice daily until rash improves.  Dispense: 45 g; Refill: 0  Supervision of other normal pregnancy, antepartum Assessment & Plan: Doing well. BP normal. TWG 27 lb 3.2 oz (12.3 kg). Taking prenatal vitamin daily and aspirin  81 mg daily. Next prenatal appointment in 1 weeks.     History of C-section Assessment & Plan: rLTCS on 12/29/2024.   Preterm labor symptoms and general obstetric precautions including but not limited to vaginal bleeding,  contractions, leaking of fluid and fetal movement were reviewed in detail with the patient. Please refer to After Visit Summary for other counseling recommendations.  No follow-ups on file.  Future Appointments  Date Time Provider Department Center  12/04/2024  1:20 PM [facility] PROVIDER [facility] None    Betsey CHRISTELLA Helling, MD  "

## 2024-12-04 ENCOUNTER — Ambulatory Visit
